# Patient Record
Sex: Male | Born: 1975 | Race: White | Hispanic: No | Marital: Single | Smoking: Former smoker
Health system: Southern US, Community
[De-identification: ages and names within clinical notes are randomized; demographics above are authoritative.]

## PROBLEM LIST (undated history)

## (undated) DIAGNOSIS — R112 Nausea with vomiting, unspecified: Secondary | ICD-10-CM

## (undated) DIAGNOSIS — E7800 Pure hypercholesterolemia, unspecified: Secondary | ICD-10-CM

## (undated) DIAGNOSIS — Z9889 Other specified postprocedural states: Secondary | ICD-10-CM

## (undated) DIAGNOSIS — T8859XA Other complications of anesthesia, initial encounter: Secondary | ICD-10-CM

## (undated) DIAGNOSIS — I10 Essential (primary) hypertension: Secondary | ICD-10-CM

## (undated) HISTORY — PX: BACK SURGERY: SHX140

---

## 2003-01-31 HISTORY — PX: CARPAL TUNNEL RELEASE: SHX101

## 2013-02-28 ENCOUNTER — Ambulatory Visit (INDEPENDENT_AMBULATORY_CARE_PROVIDER_SITE_OTHER): Payer: BC Managed Care – PPO | Admitting: Family Medicine

## 2013-02-28 ENCOUNTER — Encounter (INDEPENDENT_AMBULATORY_CARE_PROVIDER_SITE_OTHER): Payer: Self-pay

## 2013-02-28 VITALS — BP 134/69 | HR 82 | Temp 98.6°F | Ht 72.0 in | Wt 258.2 lb

## 2013-02-28 DIAGNOSIS — J069 Acute upper respiratory infection, unspecified: Secondary | ICD-10-CM

## 2013-02-28 MED ORDER — AZITHROMYCIN 250 MG PO TABS: ORAL_TABLET | ORAL | Status: DC

## 2013-02-28 NOTE — Progress Notes (Signed)
   Subjective:    Patient ID: Tanner Wilson, male    DOB: 11/06/1975, 38 y.o.   MRN: [id number]  HPI  This 38 y.o. male presents for evaluation of uri sx's for over a week.  He c/o feeling like he is in Tunnel and feels some discomfort in his bilateral ears.  Review of Systems C/o uri sx's   No chest pain, SOB, HA, dizziness, vision change, N/V, diarrhea, constipation, dysuria, urinary urgency or frequency, myalgias, arthralgias or rash.  Objective:   Physical Exam Vital signs noted  Well developed well nourished male.  HEENT - Head atraumatic Normocephalic                Eyes - PERRLA, Conjuctiva - clear Sclera- Clear EOMI                Ears - EAC's Wnl TM's Wnl Gross Hearing WNL                Nose - Nares decreased patentcy                Throat - oropharanx wnl Respiratory - Lungs CTA bilateral Cardiac - RRR S1 and S2 without murmur GI - Abdomen soft Nontender and bowel sounds active x 4        Assessment & Plan:  URI (upper respiratory infection) - Plan: azithromycin (ZITHROMAX) 250 MG tablet Push po fluids, rest, tylenol and motrin otc prn as directed for fever, arthralgias, and myalgias.  Follow up prn if sx's continue or persist.  Deatra CanterWilliam J Oxford FNP

## 2013-02-28 NOTE — Patient Instructions (Signed)

## 2013-04-04 ENCOUNTER — Encounter: Payer: Self-pay | Admitting: Nurse Practitioner

## 2013-04-04 ENCOUNTER — Ambulatory Visit (INDEPENDENT_AMBULATORY_CARE_PROVIDER_SITE_OTHER): Payer: BC Managed Care – PPO | Admitting: Nurse Practitioner

## 2013-04-04 VITALS — BP 138/82 | HR 78 | Temp 98.8°F | Ht 74.0 in | Wt 253.0 lb

## 2013-04-04 DIAGNOSIS — Z0000 Encounter for general adult medical examination without abnormal findings: Secondary | ICD-10-CM

## 2013-04-04 LAB — POCT CBC
GRANULOCYTE PERCENT: 57.8 % (ref 37–80)
HCT, POC: 47.5 % (ref 43.5–53.7)
Hemoglobin: 15.6 g/dL (ref 14.1–18.1)
Lymph, poc: 2.5 (ref 0.6–3.4)
MCH, POC: 28.9 pg (ref 27–31.2)
MCHC: 32.8 g/dL (ref 31.8–35.4)
MCV: 88.2 fL (ref 80–97)
MPV: 7.4 fL (ref 0–99.8)
PLATELET COUNT, POC: 203 10*3/uL (ref 142–424)
POC Granulocyte: 4 (ref 2–6.9)
POC LYMPH PERCENT: 36 %L (ref 10–50)
RBC: 5.4 M/uL (ref 4.69–6.13)
RDW, POC: 12.6 %
WBC: 7 10*3/uL (ref 4.6–10.2)

## 2013-04-04 NOTE — Patient Instructions (Signed)

## 2013-04-04 NOTE — Progress Notes (Signed)
   Subjective:    Patient ID: Tanner Wilson, male    DOB: 1975/08/24, 38 y.o.   MRN: [id number]  HPI Patient here today for CPE- he is doing well without complaints today.    Review of Systems  Constitutional: Negative.   HENT: Negative.   Eyes: Negative.   Respiratory: Negative.   Cardiovascular: Negative.   Gastrointestinal: Negative.   Endocrine: Negative.   Genitourinary: Negative.   Musculoskeletal: Negative.   Neurological: Negative.   Hematological: Negative.   Psychiatric/Behavioral: Negative.   All other systems reviewed and are negative.       Objective:   Physical Exam  Constitutional: He is oriented to person, place, and time. He appears well-developed and well-nourished.  HENT:  Head: Normocephalic.  Right Ear: External ear normal.  Left Ear: External ear normal.  Nose: Nose normal.  Mouth/Throat: Oropharynx is clear and moist.  Eyes: EOM are normal. Pupils are equal, round, and reactive to light.  Neck: Normal range of motion. Neck supple. No JVD present. No thyromegaly present.  Cardiovascular: Normal rate, regular rhythm, normal heart sounds and intact distal pulses.  Exam reveals no gallop and no friction rub.   No murmur heard. Pulmonary/Chest: Effort normal and breath sounds normal. No respiratory distress. He has no wheezes. He has no rales. He exhibits no tenderness.  Abdominal: Soft. Bowel sounds are normal. He exhibits no mass. There is no tenderness.  Genitourinary: Penis normal.  Musculoskeletal: Normal range of motion. He exhibits no edema.  Lymphadenopathy:    He has no cervical adenopathy.  Neurological: He is alert and oriented to person, place, and time. No cranial nerve deficit.  Skin: Skin is warm and dry.  Psychiatric: He has a normal mood and affect. His behavior is normal. Judgment and thought content normal.     BP 138/82  Pulse 78  Temp(Src) 98.8 F (37.1 C) (Oral)  Ht _0  (1.88 m)  Wt 253 lb (114.76 kg)  BMI 32.47  kg/m2       Assessment & Plan:   1. Annual physical exam    Orders Placed This Encounter  Procedures  . CMP14+EGFR  . NMR, lipoprofile  . PSA, total and free  . Thyroid Panel With TSH  . POCT CBC    Labs pending Health maintenance reviewed Diet and exercise encouraged Continue all meds Follow up  In 1 year   [place], FNP

## 2013-04-06 LAB — THYROID PANEL WITH TSH
Free Thyroxine Index: 2 (ref 1.2–4.9)
T3 UPTAKE RATIO: 32 % (ref 24–39)
T4 TOTAL: 6.3 ug/dL (ref 4.5–12.0)
TSH: 0.199 u[IU]/mL — AB (ref 0.450–4.500)

## 2013-04-06 LAB — NMR, LIPOPROFILE
Cholesterol: 187 mg/dL (ref <200)
HDL CHOLESTEROL BY NMR: 46 mg/dL (ref >=40)
HDL Particle Number: 29.6 umol/L — ABNORMAL LOW (ref >=30.5)
LDL Particle Number: 1599 nmol/L — ABNORMAL HIGH (ref <1000)
LDL Size: 21.3 nm (ref >20.5)
LDLC SERPL CALC-MCNC: 120 mg/dL — AB (ref <100)
LP-IR Score: 60 — ABNORMAL HIGH (ref <=45)
Small LDL Particle Number: 441 nmol/L (ref <=527)
TRIGLYCERIDES BY NMR: 103 mg/dL (ref <150)

## 2013-04-06 LAB — CMP14+EGFR
ALBUMIN: 4.8 g/dL (ref 3.5–5.5)
ALK PHOS: 85 IU/L (ref 39–117)
ALT: 27 IU/L (ref 0–44)
AST: 23 IU/L (ref 0–40)
Albumin/Globulin Ratio: 1.6 (ref 1.1–2.5)
BILIRUBIN TOTAL: 0.8 mg/dL (ref 0.0–1.2)
BUN / CREAT RATIO: 10 (ref 8–19)
BUN: 10 mg/dL (ref 6–20)
CHLORIDE: 100 mmol/L (ref 97–108)
CO2: 22 mmol/L (ref 18–29)
Calcium: 9.2 mg/dL (ref 8.7–10.2)
Creatinine, Ser: 1.03 mg/dL (ref 0.76–1.27)
GFR calc Af Amer: 108 mL/min/{1.73_m2} (ref >59)
GFR, EST NON AFRICAN AMERICAN: 94 mL/min/{1.73_m2} (ref >59)
Globulin, Total: 3 g/dL (ref 1.5–4.5)
Glucose: 94 mg/dL (ref 65–99)
POTASSIUM: 4.1 mmol/L (ref 3.5–5.2)
Sodium: 141 mmol/L (ref 134–144)
Total Protein: 7.8 g/dL (ref 6.0–8.5)

## 2013-04-06 LAB — PSA, TOTAL AND FREE
PSA FREE PCT: 42.5 %
PSA FREE: 0.17 ng/mL
PSA: 0.4 ng/mL (ref 0.0–4.0)

## 2013-04-08 ENCOUNTER — Telehealth: Payer: Self-pay | Admitting: *Deleted

## 2013-04-08 NOTE — Telephone Encounter (Signed)
Message copied by Almeta MonasSTONE, JANIE M on Tue Apr 08, 2013 11:22 AM ------      Message from: Bennie PieriniMARTIN, MARY-MARGARET      Created: Mon Apr 07, 2013  1:59 PM       Cbc normal      Kidney and liver function stable      LDL particle # and LDL are elevated- Low CVA risk currently- low fat diet and exercise and recheck in 3 months      PSA normal      TSH is low which means Hyperhyroidism- but rest of thyroid panel is normal- repeat labs iin 3 months ------

## 2013-04-08 NOTE — Telephone Encounter (Signed)
Patient aware.

## 2013-04-08 NOTE — Telephone Encounter (Signed)
Please call for results

## 2013-04-24 ENCOUNTER — Encounter: Payer: Self-pay | Admitting: *Deleted

## 2014-05-12 ENCOUNTER — Ambulatory Visit (INDEPENDENT_AMBULATORY_CARE_PROVIDER_SITE_OTHER): Payer: BC Managed Care – PPO | Admitting: Nurse Practitioner

## 2014-05-12 ENCOUNTER — Encounter: Payer: Self-pay | Admitting: Nurse Practitioner

## 2014-05-12 VITALS — BP 149/97 | HR 95 | Temp 98.1°F | Ht 74.0 in | Wt 269.0 lb

## 2014-05-12 DIAGNOSIS — T148 Other injury of unspecified body region: Secondary | ICD-10-CM | POA: Diagnosis not present

## 2014-05-12 DIAGNOSIS — W57XXXA Bitten or stung by nonvenomous insect and other nonvenomous arthropods, initial encounter: Secondary | ICD-10-CM | POA: Diagnosis not present

## 2014-05-12 MED ORDER — EPINEPHRINE 0.3 MG/0.3ML IJ SOAJ: 0.3000 mg | Freq: Once | INTRAMUSCULAR | Status: DC

## 2014-05-12 NOTE — Progress Notes (Signed)
   Subjective:    Patient ID: Tanner Wilson, male    DOB: 11/28/1975, 39 y.o.   MRN: [id number]  HPI The patient states he had a tick bite 3 years ago, and 2 months later had an episode of rashes appearing around his joint, as well as N/V/D which lasted 2 days after eating a steak. He cut out red meat, and was gradually increasing red meat intake which he was tolerating well. He states on 3 days ago he ate a steak and experienced N/V/D similar to the episode he had previously without the rash, with sudden onset of weakness. These symptoms ended yesterday, states he feels fine today. He states he would like to be screened for alpha-gal's allergy.    Review of Systems  Constitutional: Negative for fever and diaphoresis.  HENT: Negative for congestion, postnasal drip and sinus pressure.   Respiratory: Negative for cough, chest tightness and shortness of breath.   Cardiovascular: Negative for chest pain and palpitations.  Musculoskeletal: Negative for joint swelling.  Skin: Negative for rash.  Neurological: Negative for syncope, facial asymmetry and light-headedness.  All other systems reviewed and are negative.      Objective:   Physical Exam  Constitutional: He is oriented to person, place, and time. He appears well-developed and well-nourished. No distress.  HENT:  Mouth/Throat: Oropharynx is clear and moist.  Cardiovascular: Normal rate, regular rhythm and normal heart sounds.   Pulmonary/Chest: Effort normal and breath sounds normal.  Neurological: He is alert and oriented to person, place, and time.  Skin: Skin is warm and dry. He is not diaphoretic.  Psychiatric: He has a normal mood and affect. His behavior is normal. Judgment and thought content normal.   BP 149/97 mmHg  Pulse 95  Temp(Src) 98.1 F (36.7 C) (Oral)  Ht 6\' 2"  (1.88 m)  Wt 269 lb (122.018 kg)  BMI 34.52 kg/m2        Assessment & Plan:   1. Tick bite    Orders Placed This Encounter  Procedures  .  Rocky mtn spotted fvr abs pnl(IgG+IgM)  . Lyme Ab/Western Blot Reflex  . Alpha-Gal Panel   Meds ordered this encounter  Medications  . EPINEPHrine 0.3 mg/0.3 mL IJ SOAJ injection    Sig: Inject 0.3 mLs (0.3 mg total) into the muscle once.    Dispense:  2 Device    Refill:  1    Order Specific Question:  Supervising Provider    Answer:  Ernestina PennaMOORE, DONALD W [[id number]]   Avoid red meat and pork until test are back RTO prn  Mary-Margaret Daphine DeutscherMartin, FNP

## 2014-05-12 NOTE — Patient Instructions (Signed)
Epinephrine injection (Auto-injector) °What is this medicine? °EPINEPHRINE (ep i NEF rin) is used for the emergency treatment of severe allergic reactions. You should keep this medicine with you at all times. °This medicine may be used for other purposes; ask your health care provider or pharmacist if you have questions. °COMMON BRAND NAME(S): Adrenaclick, Auvi-Q, EpiPen, Twinject °What should I tell my health care provider before I take this medicine? °They need to know if you have any of the following conditions: °-diabetes °-heart disease °-high blood pressure °-lung or breathing disease, like asthma °-Parkinson's disease °-thyroid disease °-an unusual or allergic reaction to epinephrine, sulfites, other medicines, foods, dyes, or preservatives °-pregnant or trying to get pregnant °-breast-feeding °How should I use this medicine? °This medicine is for injection into the outer thigh. Your doctor or health care professional will instruct you on the proper use of the device during an emergency. Read all directions carefully and make sure you understand them. Do not use more often than directed. °Talk to your pediatrician regarding the use of this medicine in children. Special care may be needed. This drug is commonly used in children. A special device is available for use in children. °Overdosage: If you think you have taken too much of this medicine contact a poison control center or emergency room at once. °NOTE: This medicine is only for you. Do not share this medicine with others. °What if I miss a dose? °This does not apply. You should only use this medicine for an allergic reaction. °What may interact with this medicine? °This medicine is only used during an emergency. Significant drug interactions are not likely during emergency use. °This list may not describe all possible interactions. Give your health care provider a list of all the medicines, herbs, non-prescription drugs, or dietary supplements you use.  Also tell them if you smoke, drink alcohol, or use illegal drugs. Some items may interact with your medicine. °What should I watch for while using this medicine? °Keep this medicine ready for use in the case of a severe allergic reaction. Make sure that you have the phone number of your doctor or health care professional and local hospital ready. Remember to check the expiration date of your medicine regularly. You may need to have additional units of this medicine with you at work, school, or other places. Talk to your doctor or health care professional about your need for extra units. Some emergencies may require an additional dose. Check with your doctor or a health care professional before using an extra dose. °After use, go to the nearest hospital or call 911. Avoid physical activity. Make sure the treating health care professional knows you have received an injection of this medicine. You will receive additional instructions on what to do during and after use of this medicine before a medical emergency occurs. °What side effects may I notice from receiving this medicine? °Side effects that you should report to your doctor or health care professional as soon as possible: °-allergic reactions like skin rash, itching or hives, swelling of the face, lips, or tongue °-breathing problems °-chest pain °-flushing °-irregular or pounding heartbeat °-numbness in fingers or toes °-vomiting °Side effects that usually do not require medical attention (report to your doctor or health care professional if they continue or are bothersome): °-anxiety or nervousness °-dizzy, drowsy °-dry mouth °-headache °-increased sweating °-nausea °-tired, weak °This list may not describe all possible side effects. Call your doctor for medical advice about side effects. You may report side   effects to FDA at 1-800-FDA-1088. °Where should I keep my medicine? °Keep out of the reach of children. °Store at room temperature between 15 and 30  degrees C (59 and 86 degrees F). Protect from light and heat. The solution should be clear in color. If the solution is discolored or contains particles it must be replaced. Throw away any unused medicine after the expiration date. Ask your doctor or pharmacist about proper disposal of the injector if it is expired or has been used. Always replace your auto-injector before it expires. °NOTE: This sheet is a summary. It may not cover all possible information. If you have questions about this medicine, talk to your doctor, pharmacist, or health care provider. °© 2015, Elsevier/[employer]. (2012-05-27 14:59:01) ° °

## 2014-05-16 LAB — ROCKY MTN SPOTTED FVR ABS PNL(IGG+IGM)
RMSF IGG: POSITIVE — AB
RMSF IgM: 0.21 index (ref 0.00–0.89)

## 2014-05-16 LAB — ALPHA-GAL PANEL
ALPHA GAL IGE: 1.9 kU/L — AB (ref <0.35)
BEEF CLASS INTERPRETATION: 2
Beef (Bos spp) IgE: 1.15 kU/L — ABNORMAL HIGH (ref <0.35)
Class Interpretation: 2
Lamb/Mutton (Ovis spp) IgE: 0.34 kU/L (ref <0.35)
PORK (SUS SPP) IGE: 0.75 kU/L — AB (ref <0.35)

## 2014-05-16 LAB — RMSF, IGG, IFA: RMSF, IGG, IFA: 1:64 {titer} — ABNORMAL HIGH

## 2014-05-16 LAB — LYME AB/WESTERN BLOT REFLEX
LYME DISEASE AB, QUANT, IGM: <0.8 index (ref 0.00–0.79)
Lyme IgG/IgM Ab: <0.91 {ISR} (ref 0.00–0.90)

## 2014-05-18 ENCOUNTER — Other Ambulatory Visit: Payer: Self-pay | Admitting: Nurse Practitioner

## 2014-05-18 MED ORDER — DOXYCYCLINE HYCLATE 100 MG PO TABS: 100.0000 mg | ORAL_TABLET | Freq: Two times a day (BID) | ORAL | Status: DC

## 2014-05-18 NOTE — Progress Notes (Signed)
PATIENT AWARE OF LABS AND RX

## 2015-07-16 ENCOUNTER — Encounter: Payer: Self-pay | Admitting: Family

## 2015-07-16 ENCOUNTER — Ambulatory Visit (INDEPENDENT_AMBULATORY_CARE_PROVIDER_SITE_OTHER): Payer: BC Managed Care – PPO | Admitting: Family

## 2015-07-16 VITALS — BP 139/91 | HR 72 | Temp 97.8°F | Ht 74.0 in | Wt 285.4 lb

## 2015-07-16 DIAGNOSIS — Z8249 Family history of ischemic heart disease and other diseases of the circulatory system: Secondary | ICD-10-CM

## 2015-07-16 DIAGNOSIS — R0681 Apnea, not elsewhere classified: Secondary | ICD-10-CM | POA: Diagnosis not present

## 2015-07-16 DIAGNOSIS — R0683 Snoring: Secondary | ICD-10-CM | POA: Diagnosis not present

## 2015-07-16 DIAGNOSIS — Z91018 Allergy to other foods: Secondary | ICD-10-CM | POA: Insufficient documentation

## 2015-07-16 DIAGNOSIS — I10 Essential (primary) hypertension: Secondary | ICD-10-CM | POA: Diagnosis not present

## 2015-07-16 MED ORDER — HYDROCHLOROTHIAZIDE 25 MG PO TABS: 25.0000 mg | ORAL_TABLET | Freq: Every day | ORAL | Status: DC

## 2015-07-16 NOTE — Patient Instructions (Signed)
Sleep Apnea  Sleep apnea is a sleep disorder characterized by abnormal pauses in breathing while you sleep. When your breathing pauses, the level of oxygen in your blood decreases. This causes you to move out of deep sleep and into light sleep. As a result, your quality of sleep is poor, and the system that carries your blood throughout your body (cardiovascular system) experiences stress. If sleep apnea remains untreated, the following conditions can develop:  High blood pressure (hypertension).  Coronary artery disease.  Inability to achieve or maintain an erection (impotence).  Impairment of your thought process (cognitive dysfunction). There are three types of sleep apnea: 1. Obstructive sleep apnea--Pauses in breathing during sleep because of a blocked airway. 2. Central sleep apnea--Pauses in breathing during sleep because the area of the brain that controls your breathing does not send the correct signals to the muscles that control breathing. 3. Mixed sleep apnea--A combination of both obstructive and central sleep apnea. RISK FACTORS The following risk factors can increase your risk of developing sleep apnea:  Being overweight.  Smoking.  Having narrow passages in your nose and throat.  Being of older age.  Being male.  Alcohol use.  Sedative and tranquilizer use.  Ethnicity. Among individuals younger than 35 years, African Americans are at increased risk of sleep apnea. SYMPTOMS   Difficulty staying asleep.  Daytime sleepiness and fatigue.  Loss of energy.  Irritability.  Loud, heavy snoring.  Morning headaches.  Trouble concentrating.  Forgetfulness.  Decreased interest in sex.  Unexplained sleepiness. DIAGNOSIS  In order to diagnose sleep apnea, your caregiver will perform a physical examination. A sleep study done in the comfort of your own home may be appropriate if you are otherwise healthy. Your caregiver may also recommend that you spend the  night in a sleep lab. In the sleep lab, several monitors record information about your heart, lungs, and brain while you sleep. Your leg and arm movements and blood oxygen level are also recorded. TREATMENT The following actions may help to resolve mild sleep apnea:  Sleeping on your side.   Using a decongestant if you have nasal congestion.   Avoiding the use of depressants, including alcohol, sedatives, and narcotics.   Losing weight and modifying your diet if you are overweight. There also are devices and treatments to help open your airway:  Oral appliances. These are custom-made mouthpieces that shift your lower jaw forward and slightly open your bite. This opens your airway.  Devices that create positive airway pressure. This positive pressure "splints" your airway open to help you breathe better during sleep. The following devices create positive airway pressure:  Continuous positive airway pressure (CPAP) device. The CPAP device creates a continuous level of air pressure with an air pump. The air is delivered to your airway through a mask while you sleep. This continuous pressure keeps your airway open.  Nasal expiratory positive airway pressure (EPAP) device. The EPAP device creates positive air pressure as you exhale. The device consists of single-use valves, which are inserted into each nostril and held in place by adhesive. The valves create very little resistance when you inhale but create much more resistance when you exhale. That increased resistance creates the positive airway pressure. This positive pressure while you exhale keeps your airway open, making it easier to breath when you inhale again.  Bilevel positive airway pressure (BPAP) device. The BPAP device is used mainly in patients with central sleep apnea. This device is similar to the CPAP device because   it also uses an air pump to deliver continuous air pressure through a mask. However, with the BPAP machine, the  pressure is set at two different levels. The pressure when you exhale is lower than the pressure when you inhale.  Surgery. Typically, surgery is only done if you cannot comply with less invasive treatments or if the less invasive treatments do not improve your condition. Surgery involves removing excess tissue in your airway to create a wider passage way.   This information is not intended to replace advice given to you by your health care provider. Make sure you discuss any questions you have with your health care provider.   Document Released: 01/06/2002 Document Revised: 02/06/2014 Document Reviewed: 05/25/2011 Elsevier Interactive Patient Education 2016 [employer].   

## 2015-07-16 NOTE — Progress Notes (Signed)
   Subjective:    Patient ID: Tanner Wilson, male    DOB: [birth year], 40 y.o.   MRN: [id number]  PT presents to the office today requesting lab work to check his alpha gal/meat allergy. PT states he has had this going on for a few years, but was just tested last year. Pt denies any recent tick bites. Pt denies any headache, palpitations, SOB, or edema at this time. Wife states patient is snoring and gasps at night.  Hypertension This is a recurrent problem. The current episode started more than 1 year ago. The problem has been waxing and waning since onset. The problem is uncontrolled. Associated symptoms include headaches and malaise/fatigue. Pertinent negatives include no anxiety, palpitations or peripheral edema. Risk factors for coronary artery disease include family history, obesity and sedentary lifestyle. Past treatments include nothing. The current treatment provides no improvement. There is no history of kidney disease, CAD/MI, CVA or heart failure.       Review of Systems  Constitutional: Positive for malaise/fatigue.  Respiratory: Negative.   Cardiovascular: Negative.  Negative for palpitations.  Gastrointestinal: Negative.   Endocrine: Negative.   Genitourinary: Negative.   Musculoskeletal: Negative.   Neurological: Positive for headaches.  Hematological: Negative.   Psychiatric/Behavioral: Negative.   All other systems reviewed and are negative.      Objective:   Physical Exam  Constitutional: He is oriented to person, place, and time. He appears well-developed and well-nourished. No distress.  HENT:  Head: Normocephalic.  Eyes: Pupils are equal, round, and reactive to light. Right eye exhibits no discharge. Left eye exhibits no discharge.  Neck: Normal range of motion. Neck supple. No thyromegaly present.  Cardiovascular: Normal rate, regular rhythm, normal heart sounds and intact distal pulses.   No murmur heard. Pulmonary/Chest: Effort normal and breath sounds  normal. No respiratory distress. He has no wheezes.  Abdominal: Soft. Bowel sounds are normal. He exhibits no distension. There is no tenderness.  Musculoskeletal: Normal range of motion. He exhibits no edema or tenderness.  Neurological: He is alert and oriented to person, place, and time. He has normal reflexes. No cranial nerve deficit.  Skin: Skin is warm and dry. No rash noted. No erythema.  Psychiatric: He has a normal mood and affect. His behavior is normal. Judgment and thought content normal.  Vitals reviewed.   BP 139/91 mmHg  Pulse 72  Temp(Src) 97.8 F (36.6 C) (Oral)  Ht '6\' 2"'$  (1.88 m)  Wt 285 lb 6.4 oz (129.457 kg)  BMI 36.63 kg/m2       Assessment & Plan:  1. Essential hypertension -Pt started on HCTZ 25 mg today - CMP14+EGFR - Lipid panel - hydrochlorothiazide (HYDRODIURIL) 25 MG tablet; Take 1 tablet (25 mg total) by mouth daily.  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 Snoring - CMP14+EGFR - Ambulatory referral to Sleep Studies  4. Apnea - CMP14+EGFR - Ambulatory referral to Sleep Studies  5. Family history of heart disease - CMP14+EGFR - Lipid panel   Continue all meds Labs pending Health Maintenance reviewed Diet and exercise encouraged RTO 2 week to recheck HTN  [name], FNP

## 2015-07-21 ENCOUNTER — Other Ambulatory Visit: Payer: Self-pay | Admitting: Family

## 2015-07-21 DIAGNOSIS — E785 Hyperlipidemia, unspecified: Secondary | ICD-10-CM | POA: Insufficient documentation

## 2015-07-21 LAB — LIPID PANEL
CHOL/HDL RATIO: 5.3 ratio — AB (ref 0.0–5.0)
CHOLESTEROL TOTAL: 205 mg/dL — AB (ref 100–199)
HDL: 39 mg/dL — AB (ref >39)
LDL CALC: 142 mg/dL — AB (ref 0–99)
Triglycerides: 122 mg/dL (ref 0–149)
VLDL Cholesterol Cal: 24 mg/dL (ref 5–40)

## 2015-07-21 LAB — CMP14+EGFR
ALK PHOS: 82 IU/L (ref 39–117)
ALT: 46 IU/L — AB (ref 0–44)
AST: 31 IU/L (ref 0–40)
Albumin/Globulin Ratio: 1.2 (ref 1.2–2.2)
Albumin: 4.2 g/dL (ref 3.5–5.5)
BUN/Creatinine Ratio: 11 (ref 9–20)
BUN: 10 mg/dL (ref 6–20)
Bilirubin Total: 0.7 mg/dL (ref 0.0–1.2)
CALCIUM: 9 mg/dL (ref 8.7–10.2)
CO2: 20 mmol/L (ref 18–29)
Chloride: 101 mmol/L (ref 96–106)
Creatinine, Ser: 0.91 mg/dL (ref 0.76–1.27)
GFR calc Af Amer: 124 mL/min/{1.73_m2} (ref >59)
GFR, EST NON AFRICAN AMERICAN: 107 mL/min/{1.73_m2} (ref >59)
GLUCOSE: 107 mg/dL — AB (ref 65–99)
Globulin, Total: 3.4 g/dL (ref 1.5–4.5)
Potassium: 4.5 mmol/L (ref 3.5–5.2)
Sodium: 139 mmol/L (ref 134–144)
Total Protein: 7.6 g/dL (ref 6.0–8.5)

## 2015-07-21 LAB — ROCKY MTN SPOTTED FVR ABS PNL(IGG+IGM)
RMSF IGG: POSITIVE — AB
RMSF IgM: 0.29 index (ref 0.00–0.89)

## 2015-07-21 LAB — ALPHA-GAL PANEL
Alpha Gal IgE*: 1.62 kU/L — ABNORMAL HIGH (ref <0.35)
Beef (Bos spp) IgE: 0.97 kU/L — ABNORMAL HIGH (ref <0.35)
Class Interpretation: 1
Class Interpretation: 2
Lamb/Mutton (Ovis spp) IgE: 0.2 kU/L (ref <0.35)
Pork (Sus spp) IgE: 0.57 kU/L — ABNORMAL HIGH (ref <0.35)

## 2015-07-21 LAB — RMSF, IGG, IFA: RMSF, IGG, IFA: 1:64 {titer} — ABNORMAL HIGH

## 2015-07-21 MED ORDER — ATORVASTATIN CALCIUM 20 MG PO TABS: 20.0000 mg | ORAL_TABLET | Freq: Every day | ORAL | Status: DC

## 2015-08-05 ENCOUNTER — Ambulatory Visit: Payer: BC Managed Care – PPO | Admitting: Family

## 2015-08-06 ENCOUNTER — Encounter: Payer: Self-pay | Admitting: Nurse Practitioner

## 2015-08-13 ENCOUNTER — Encounter: Payer: Self-pay | Admitting: Family

## 2015-08-13 ENCOUNTER — Ambulatory Visit (INDEPENDENT_AMBULATORY_CARE_PROVIDER_SITE_OTHER): Payer: BC Managed Care – PPO | Admitting: Family

## 2015-08-13 VITALS — BP 140/96 | HR 95 | Temp 97.8°F | Ht 74.0 in | Wt 285.0 lb

## 2015-08-13 DIAGNOSIS — E785 Hyperlipidemia, unspecified: Secondary | ICD-10-CM | POA: Diagnosis not present

## 2015-08-13 DIAGNOSIS — E8881 Metabolic syndrome: Secondary | ICD-10-CM

## 2015-08-13 DIAGNOSIS — W57XXXA Bitten or stung by nonvenomous insect and other nonvenomous arthropods, initial encounter: Secondary | ICD-10-CM

## 2015-08-13 DIAGNOSIS — I10 Essential (primary) hypertension: Secondary | ICD-10-CM | POA: Diagnosis not present

## 2015-08-13 DIAGNOSIS — Z91018 Allergy to other foods: Secondary | ICD-10-CM

## 2015-08-13 MED ORDER — LISINOPRIL-HYDROCHLOROTHIAZIDE 20-12.5 MG PO TABS: 1.0000 | ORAL_TABLET | Freq: Every day | ORAL | Status: AC

## 2015-08-13 MED ORDER — EPINEPHRINE 0.3 MG/0.3ML IJ SOAJ: 0.3000 mg | Freq: Once | INTRAMUSCULAR | Status: DC

## 2015-08-13 MED ORDER — ATORVASTATIN CALCIUM 20 MG PO TABS: 20.0000 mg | ORAL_TABLET | Freq: Every day | ORAL | Status: DC

## 2015-08-13 NOTE — Patient Instructions (Signed)
Hypertension Hypertension, commonly called high blood pressure, is when the force of blood pumping through your arteries is too strong. Your arteries are the blood vessels that carry blood from your heart throughout your body. A blood pressure reading consists of a higher number over a lower number, such as 110/72. The higher number (systolic) is the pressure inside your arteries when your heart pumps. The lower number (diastolic) is the pressure inside your arteries when your heart relaxes. Ideally you want your blood pressure below 120/80. Hypertension forces your heart to work harder to pump blood. Your arteries may become narrow or stiff. Having untreated or uncontrolled hypertension can cause heart attack, stroke, kidney disease, and other problems. RISK FACTORS Some risk factors for high blood pressure are controllable. Others are not.  Risk factors you cannot control include:   Race. You may be at higher risk if you are African American.  Age. Risk increases with age.  Gender. Men are at higher risk than women before age 45 years. After age 65, women are at higher risk than men. Risk factors you can control include:  Not getting enough exercise or physical activity.  Being overweight.  Getting too much fat, sugar, calories, or salt in your diet.  Drinking too much alcohol. SIGNS AND SYMPTOMS Hypertension does not usually cause signs or symptoms. Extremely high blood pressure (hypertensive crisis) may cause headache, anxiety, shortness of breath, and nosebleed. DIAGNOSIS To check if you have hypertension, your health care provider will measure your blood pressure while you are seated, with your arm held at the level of your heart. It should be measured at least twice using the same arm. Certain conditions can cause a difference in blood pressure between your right and left arms. A blood pressure reading that is higher than normal on one occasion does not mean that you need treatment. If  it is not clear whether you have high blood pressure, you may be asked to return on a different day to have your blood pressure checked again. Or, you may be asked to monitor your blood pressure at home for 1 or more weeks. TREATMENT Treating high blood pressure includes making lifestyle changes and possibly taking medicine. Living a healthy lifestyle can help lower high blood pressure. You may need to change some of your habits. Lifestyle changes may include:  Following the DASH diet. This diet is high in fruits, vegetables, and whole grains. It is low in salt, red meat, and added sugars.  Keep your sodium intake below 2,300 mg per day.  Getting at least 30-45 minutes of aerobic exercise at least 4 times per week.  Losing weight if necessary.  Not smoking.  Limiting alcoholic beverages.  Learning ways to reduce stress. Your health care provider may prescribe medicine if lifestyle changes are not enough to get your blood pressure under control, and if one of the following is true:  You are 18-59 years of age and your systolic blood pressure is above 140.  You are 60 years of age or older, and your systolic blood pressure is above 150.  Your diastolic blood pressure is above 90.  You have diabetes, and your systolic blood pressure is over 140 or your diastolic blood pressure is over 90.  You have kidney disease and your blood pressure is above 140/90.  You have heart disease and your blood pressure is above 140/90. Your personal target blood pressure may vary depending on your medical conditions, your age, and other factors. HOME CARE INSTRUCTIONS    Have your blood pressure rechecked as directed by your health care provider.   Take medicines only as directed by your health care provider. Follow the directions carefully. Blood pressure medicines must be taken as prescribed. The medicine does not work as well when you skip doses. Skipping doses also puts you at risk for  problems.  Do not smoke.   Monitor your blood pressure at home as directed by your health care provider. SEEK MEDICAL CARE IF:   You think you are having a reaction to medicines taken.  You have recurrent headaches or feel dizzy.  You have swelling in your ankles.  You have trouble with your vision. SEEK IMMEDIATE MEDICAL CARE IF:  You develop a severe headache or confusion.  You have unusual weakness, numbness, or feel faint.  You have severe chest or abdominal pain.  You vomit repeatedly.  You have trouble breathing. MAKE SURE YOU:   Understand these instructions.  Will watch your condition.  Will get help right away if you are not doing well or get worse.   This information is not intended to replace advice given to you by your health care provider. Make sure you discuss any questions you have with your health care provider.   Document Released: 01/16/2005 Document Revised: 06/02/2014 Document Reviewed: 11/08/2012 Elsevier Interactive Patient Education 2016 Elsevier Inc[employer] DASH Eating Plan DASH stands for "Dietary Approaches to Stop Hypertension." The DASH eating plan is a healthy eating plan that has been shown to reduce high blood pressure (hypertension). Additional health benefits may include reducing the risk of type 2 diabetes mellitus, heart disease, and stroke. The DASH eating plan may also help with weight loss. WHAT DO I NEED TO KNOW ABOUT THE DASH EATING PLAN? For the DASH eating plan, you will follow these general guidelines:  Choose foods with a percent daily value for sodium of less than 5% (as listed on the food label).  Use salt-free seasonings or herbs instead of table salt or sea salt.  Check with your health care provider or pharmacist before using salt substitutes.  Eat lower-sodium products, often labeled as "lower sodium" or "no salt added."  Eat fresh foods.  Eat more vegetables, fruits, and low-fat dairy products.  Choose whole grains.  Look for the word "whole" as the first word in the ingredient list.  Choose fish and skinless chicken or turkey more often than red meat. Limit fish, poultry, and meat to 6 oz (170 g) each day.  Limit sweets, desserts, sugars, and sugary drinks.  Choose heart-healthy fats.  Limit cheese to 1 oz (28 g) per day.  Eat more home-cooked food and less restaurant, buffet, and fast food.  Limit fried foods.  Cook foods using methods other than frying.  Limit canned vegetables. If you do use them, rinse them well to decrease the sodium.  When eating at a restaurant, ask that your food be prepared with less salt, or no salt if possible. WHAT FOODS CAN I EAT? Seek help from a dietitian for individual calorie needs. Grains Whole grain or whole wheat bread. Brown rice. Whole grain or whole wheat pasta. Quinoa, bulgur, and whole grain cereals. Low-sodium cereals. Corn or whole wheat flour tortillas. Whole grain cornbread. Whole grain crackers. Low-sodium crackers. Vegetables Fresh or frozen vegetables (raw, steamed, roasted, or grilled). Low-sodium or reduced-sodium tomato and vegetable juices. Low-sodium or reduced-sodium tomato sauce and paste. Low-sodium or reduced-sodium canned vegetables.  Fruits All fresh, canned (in natural juice), or frozen fruits. Meat and Other   Protein Products Ground beef (85% or leaner), grass-fed beef, or beef trimmed of fat. Skinless chicken or turkey. Ground chicken or turkey. Pork trimmed of fat. All fish and seafood. Eggs. Dried beans, peas, or lentils. Unsalted nuts and seeds. Unsalted canned beans. Dairy Low-fat dairy products, such as skim or 1% milk, 2% or reduced-fat cheeses, low-fat ricotta or cottage cheese, or plain low-fat yogurt. Low-sodium or reduced-sodium cheeses. Fats and Oils Tub margarines without trans fats. Light or reduced-fat mayonnaise and salad dressings (reduced sodium). Avocado. Safflower, olive, or canola oils. Natural peanut or almond  butter. Other Unsalted popcorn and pretzels. The items listed above may not be a complete list of recommended foods or beverages. Contact your dietitian for more options. WHAT FOODS ARE NOT RECOMMENDED? Grains White bread. White pasta. White rice. Refined cornbread. Bagels and croissants. Crackers that contain trans fat. Vegetables Creamed or fried vegetables. Vegetables in a cheese sauce. Regular canned vegetables. Regular canned tomato sauce and paste. Regular tomato and vegetable juices. Fruits Dried fruits. Canned fruit in light or heavy syrup. Fruit juice. Meat and Other Protein Products Fatty cuts of meat. Ribs, chicken wings, bacon, sausage, bologna, salami, chitterlings, fatback, hot dogs, bratwurst, and packaged luncheon meats. Salted nuts and seeds. Canned beans with salt. Dairy Whole or 2% milk, cream, half-and-half, and cream cheese. Whole-fat or sweetened yogurt. Full-fat cheeses or blue cheese. Nondairy creamers and whipped toppings. Processed cheese, cheese spreads, or cheese curds. Condiments Onion and garlic salt, seasoned salt, table salt, and sea salt. Canned and packaged gravies. Worcestershire sauce. Tartar sauce. Barbecue sauce. Teriyaki sauce. Soy sauce, including reduced sodium. Steak sauce. Fish sauce. Oyster sauce. Cocktail sauce. Horseradish. Ketchup and mustard. Meat flavorings and tenderizers. Bouillon cubes. Hot sauce. Tabasco sauce. Marinades. Taco seasonings. Relishes. Fats and Oils Butter, stick margarine, lard, shortening, ghee, and bacon fat. Coconut, palm kernel, or palm oils. Regular salad dressings. Other Pickles and olives. Salted popcorn and pretzels. The items listed above may not be a complete list of foods and beverages to avoid. Contact your dietitian for more information. WHERE CAN I FIND MORE INFORMATION? National Heart, Lung, and Blood Institute: www.nhlbi.nih.gov/health/health-topics/topics/dash/   This information is not intended to replace  advice given to you by your health care provider. Make sure you discuss any questions you have with your health care provider.   Document Released: 01/05/2011 Document Revised: 02/06/2014 Document Reviewed: 11/20/2012 Elsevier Interactive Patient Education 2016 Elsevier Inc.  

## 2015-08-13 NOTE — Progress Notes (Signed)
   Subjective:    Patient ID: Tanner Wilson, male    DOB: 03-07-1975, 40 y.o.   MRN: [id number]  PT presents to the office to recheck HTN. PT's BP is not at goal. 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  Hypertension This is a chronic problem. The current episode started more than 1 year ago. The problem has been waxing and waning since onset. The problem is uncontrolled. Associated symptoms include malaise/fatigue. Pertinent negatives include no anxiety, headaches, palpitations or peripheral edema. Risk factors for coronary artery disease include family history, obesity and sedentary lifestyle. Past treatments include diuretics. The current treatment provides no improvement. There is no history of kidney disease, CAD/MI, CVA or heart failure.  Hyperlipidemia This is a chronic problem. The current episode started more than 1 year ago. The problem is uncontrolled. Recent lipid tests were reviewed and are high. Exacerbating diseases include obesity. Current antihyperlipidemic treatment includes diet change. The current treatment provides no improvement of lipids. Risk factors for coronary artery disease include dyslipidemia, hypertension, male sex, obesity and a sedentary lifestyle.  Metabolic Syndrome  Pt states he has a strenuous job and gets plenty of exercise. PT on low fat diet   Review of Systems  Constitutional: Positive for malaise/fatigue.  Cardiovascular: Negative for palpitations.  Neurological: Negative for headaches.  All other systems reviewed and are negative.      Objective:   Physical Exam  Constitutional: He is oriented to person, place, and time. He appears well-developed and well-nourished. No distress.  HENT:  Head: Normocephalic.  Eyes: Pupils are equal, round, and reactive to light. Right eye exhibits no discharge. Left eye exhibits no discharge.  Neck: Normal range of motion. Neck supple. No  thyromegaly present.  Cardiovascular: Normal rate, regular rhythm, normal heart sounds and intact distal pulses.   No murmur heard. Pulmonary/Chest: Effort normal and breath sounds normal. No respiratory distress. He has no wheezes.  Abdominal: Soft. Bowel sounds are normal. He exhibits no distension. There is no tenderness.  Musculoskeletal: Normal range of motion. He exhibits no edema or tenderness.  Neurological: He is alert and oriented to person, place, and time. He has normal reflexes. No cranial nerve deficit.  Skin: Skin is warm and dry. No rash noted. No erythema.  Psychiatric: He has a normal mood and affect. His behavior is normal. Judgment and thought content normal.  Vitals reviewed.     BP 140/96 mmHg  Pulse 95  Temp(Src) 97.8 F (36.6 C) (Oral)  Ht '6\' 2"'$  (1.88 m)  Wt 285 lb (129.275 kg)  BMI 36.58 kg/m2     Assessment & Plan:  1. Hyperlipidemia -PT told to start Lipitor  -Low fat diet - atorvastatin (LIPITOR) 20 MG tablet; Take 1 tablet (20 mg total) by mouth daily.  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 Essential hypertension -Pt started on Zestoretic 20-12.'5mg'$  and HCTZ '25mg'$  stopped today --Daily blood pressure log given with instructions on how to fill out and told to bring to next visit -Dash diet information given -Exercise encouraged - Stress Management  -Continue current meds -RTO in 2 weeks  - lisinopril-hydrochlorothiazide (ZESTORETIC) 20-12.5 MG tablet; Take 1 tablet by mouth daily.  Dispense: 90 tablet; Refill: 3 - BMP8+EGFR   Continue all meds Labs pending Health Maintenance reviewed Diet and exercise encouraged RTO 2 weeks to recheck HTN  [name], FNP

## 2015-08-14 LAB — BMP8+EGFR
BUN/Creatinine Ratio: 18 (ref 9–20)
BUN: 17 mg/dL (ref 6–20)
CALCIUM: 9.5 mg/dL (ref 8.7–10.2)
CHLORIDE: 97 mmol/L (ref 96–106)
CO2: 24 mmol/L (ref 18–29)
Creatinine, Ser: 0.92 mg/dL (ref 0.76–1.27)
GFR, EST AFRICAN AMERICAN: 122 mL/min/{1.73_m2} (ref >59)
GFR, EST NON AFRICAN AMERICAN: 106 mL/min/{1.73_m2} (ref >59)
Glucose: 155 mg/dL — ABNORMAL HIGH (ref 65–99)
Potassium: 4.8 mmol/L (ref 3.5–5.2)
Sodium: 139 mmol/L (ref 134–144)

## 2016-02-02 ENCOUNTER — Other Ambulatory Visit (HOSPITAL_BASED_OUTPATIENT_CLINIC_OR_DEPARTMENT_OTHER): Payer: Self-pay

## 2016-02-02 DIAGNOSIS — G4730 Sleep apnea, unspecified: Secondary | ICD-10-CM

## 2016-02-05 ENCOUNTER — Ambulatory Visit: Payer: BC Managed Care – PPO | Attending: Pulmonary Disease | Admitting: Neurology

## 2016-02-05 DIAGNOSIS — Z79899 Other long term (current) drug therapy: Secondary | ICD-10-CM | POA: Insufficient documentation

## 2016-02-05 DIAGNOSIS — G4733 Obstructive sleep apnea (adult) (pediatric): Secondary | ICD-10-CM | POA: Diagnosis not present

## 2016-02-05 DIAGNOSIS — G4730 Sleep apnea, unspecified: Secondary | ICD-10-CM | POA: Diagnosis present

## 2016-02-20 NOTE — Procedures (Signed)
  [place] A. Merlene Laughter, MD     [email or web address]             NOCTURNAL POLYSOMNOGRAPHY   LOCATION: [facility]   Patient Name: Tanner Wilson, Tanner Wilson Date: 02/05/2016 Gender: Male D.O.B: 21-Apr-1975 Age (years): 40 Referring Provider: Lennox Solders Height (inches): 10 Interpreting Physician: Phillips Odor MD, ABSM Weight (lbs): 275 RPSGT: Earney Hamburg BMI: 37 MRN: [id number] Neck Size: 17.00 CLINICAL INFORMATION Sleep Study Type: Split Night CPAP  Indication for sleep study: N/A  Epworth Sleepiness Score: 14  SLEEP STUDY TECHNIQUE As per the AASM Manual for the Scoring of Sleep and Associated Events v2.3 (April 2016) with a hypopnea requiring 4% desaturations.  The channels recorded and monitored were frontal, central and occipital EEG, electrooculogram (EOG), submentalis EMG (chin), nasal and oral airflow, thoracic and abdominal wall motion, anterior tibialis EMG, snore microphone, electrocardiogram, and pulse oximetry. Continuous positive airway pressure (CPAP) was initiated when the patient met split night criteria and was titrated according to treat sleep-disordered breathing.  MEDICATIONS Medications self-administered by patient taken the night of the study : N/A  Current Outpatient Prescriptions:  .  atorvastatin (LIPITOR) 20 MG tablet, Take 1 tablet (20 mg total) by mouth daily., Disp: 90 tablet, Rfl: 1 .  EPINEPHrine 0.3 mg/0.3 mL IJ SOAJ injection, Inject 0.3 mLs (0.3 mg total) into the muscle once., Disp: 2 Device, Rfl: 1 .  lisinopril-hydrochlorothiazide (ZESTORETIC) 20-12.5 MG tablet, Take 1 tablet by mouth daily., Disp: 90 tablet, Rfl: 3   RESPIRATORY PARAMETERS Diagnostic  Total AHI (/hr): 26.3 RDI (/hr): 32.0 OA Index (/hr): - CA Index (/hr): 0.5 REM AHI (/hr): 35.3 NREM AHI (/hr): 24.9 Supine AHI (/hr): 26.3 Non-supine AHI (/hr): N/A Min O2 Sat (%): 84.00 Mean O2 (%): 93.02 Time below 88% (min): 1.4   Titration  Optimal  Pressure (cm): 12 AHI at Optimal Pressure (/hr): 0.0 Min O2 at Optimal Pressure (%): 94.0 Supine % at Optimal (%): 100 Sleep % at Optimal (%): 100   SLEEP ARCHITECTURE The recording time for the entire night was 375.1 minutes.  During a baseline period of 173.5 minutes, the patient slept for 125.5 minutes in REM and nonREM, yielding a sleep efficiency of 72.3%. Sleep onset after lights out was 43.8 minutes with a REM latency of 91.0 minutes. The patient spent 3.19% of the night in stage N1 sleep, 83.27% in stage N2 sleep, 0.00% in stage N3 and 13.55% in REM.  During the titration period of 199.5 minutes, the patient slept for 150.8 minutes in REM and nonREM, yielding a sleep efficiency of 75.6%. Sleep onset after CPAP initiation was 43.7 minutes with a REM latency of 75.5 minutes. The patient spent 2.65% of the night in stage N1 sleep, 88.07% in stage N2 sleep, 0.00% in stage N3 and 9.28% in REM.  CARDIAC DATA The 2 lead EKG demonstrated sinus rhythm. The mean heart rate was 68.55 beats per minute. Other EKG findings include: None. LEG MOVEMENT DATA The total Periodic Limb Movements of Sleep (PLMS) were 1. The PLMS index was 0.22.  IMPRESSIONS - Moderate obstructive sleep apnea occurred during the diagnostic portion of the study(AHI = 26.3/hour). An optimal CPAP pressure was selected for this patient ( 12 cm of water).  Delano Metz, MD Diplomate, American Board of Sleep Medicine.

## 2016-05-12 ENCOUNTER — Ambulatory Visit (HOSPITAL_COMMUNITY)
Admission: RE | Admit: 2016-05-12 | Discharge: 2016-05-12 | Disposition: A | Discharge status: Home / Self Care | Payer: BC Managed Care – PPO | Source: Ambulatory Visit | Attending: Pulmonary Disease | Admitting: Pulmonary Disease

## 2016-05-12 ENCOUNTER — Other Ambulatory Visit (HOSPITAL_COMMUNITY): Payer: Self-pay | Admitting: Pulmonary Disease

## 2016-05-12 DIAGNOSIS — M545 Low back pain: Secondary | ICD-10-CM | POA: Insufficient documentation

## 2016-05-22 ENCOUNTER — Other Ambulatory Visit (HOSPITAL_COMMUNITY): Payer: Self-pay | Admitting: Pulmonary Disease

## 2016-05-22 DIAGNOSIS — M5432 Sciatica, left side: Secondary | ICD-10-CM

## 2016-05-24 ENCOUNTER — Other Ambulatory Visit (HOSPITAL_COMMUNITY): Payer: Self-pay | Admitting: Pulmonary Disease

## 2016-05-24 ENCOUNTER — Ambulatory Visit (HOSPITAL_COMMUNITY)
Admission: RE | Admit: 2016-05-24 | Discharge: 2016-05-24 | Disposition: A | Discharge status: Home / Self Care | Payer: BC Managed Care – PPO | Source: Ambulatory Visit | Attending: Pulmonary Disease | Admitting: Pulmonary Disease

## 2016-05-24 DIAGNOSIS — M5432 Sciatica, left side: Secondary | ICD-10-CM | POA: Insufficient documentation

## 2016-05-24 DIAGNOSIS — Z1389 Encounter for screening for other disorder: Secondary | ICD-10-CM | POA: Diagnosis present

## 2016-05-24 DIAGNOSIS — M48061 Spinal stenosis, lumbar region without neurogenic claudication: Secondary | ICD-10-CM | POA: Diagnosis not present

## 2016-05-24 DIAGNOSIS — M5126 Other intervertebral disc displacement, lumbar region: Secondary | ICD-10-CM | POA: Insufficient documentation

## 2016-05-24 DIAGNOSIS — M5135 Other intervertebral disc degeneration, thoracolumbar region: Secondary | ICD-10-CM | POA: Diagnosis not present

## 2019-10-03 ENCOUNTER — Emergency Department (HOSPITAL_COMMUNITY)
Admission: EM | Admit: 2019-10-03 | Discharge: 2019-10-04 | Disposition: A | Discharge status: Home / Self Care | Payer: BC Managed Care – PPO | Attending: Emergency Medicine | Admitting: Emergency Medicine

## 2019-10-03 ENCOUNTER — Other Ambulatory Visit (HOSPITAL_COMMUNITY): Payer: Self-pay | Admitting: Internal Medicine

## 2019-10-03 ENCOUNTER — Encounter (HOSPITAL_COMMUNITY): Payer: Self-pay | Admitting: Emergency Medicine

## 2019-10-03 ENCOUNTER — Other Ambulatory Visit: Payer: Self-pay

## 2019-10-03 ENCOUNTER — Ambulatory Visit (HOSPITAL_COMMUNITY)
Admission: RE | Admit: 2019-10-03 | Discharge: 2019-10-03 | Disposition: A | Discharge status: Home / Self Care | Payer: BC Managed Care – PPO | Source: Ambulatory Visit | Attending: Internal Medicine | Admitting: Internal Medicine

## 2019-10-03 DIAGNOSIS — Z87891 Personal history of nicotine dependence: Secondary | ICD-10-CM | POA: Diagnosis not present

## 2019-10-03 DIAGNOSIS — Z79899 Other long term (current) drug therapy: Secondary | ICD-10-CM | POA: Diagnosis not present

## 2019-10-03 DIAGNOSIS — U071 COVID-19: Secondary | ICD-10-CM | POA: Insufficient documentation

## 2019-10-03 DIAGNOSIS — I10 Essential (primary) hypertension: Secondary | ICD-10-CM | POA: Diagnosis not present

## 2019-10-03 DIAGNOSIS — J1282 Pneumonia due to coronavirus disease 2019: Secondary | ICD-10-CM

## 2019-10-03 DIAGNOSIS — R05 Cough: Secondary | ICD-10-CM | POA: Diagnosis present

## 2019-10-03 LAB — CBC WITH DIFFERENTIAL/PLATELET
Abs Immature Granulocytes: 0.01 10*3/uL (ref 0.00–0.07)
Basophils Absolute: 0 10*3/uL (ref 0.0–0.1)
Basophils Relative: 0 %
Eosinophils Absolute: 0 10*3/uL (ref 0.0–0.5)
Eosinophils Relative: 0 %
HCT: 48.2 % (ref 39.0–52.0)
Hemoglobin: 16.2 g/dL (ref 13.0–17.0)
Immature Granulocytes: 0 %
Lymphocytes Relative: 15 %
Lymphs Abs: 0.6 10*3/uL — ABNORMAL LOW (ref 0.7–4.0)
MCH: 30.6 pg (ref 26.0–34.0)
MCHC: 33.6 g/dL (ref 30.0–36.0)
MCV: 90.9 fL (ref 80.0–100.0)
Monocytes Absolute: 0.3 10*3/uL (ref 0.1–1.0)
Monocytes Relative: 7 %
Neutro Abs: 2.9 10*3/uL (ref 1.7–7.7)
Neutrophils Relative %: 78 %
Platelets: 134 10*3/uL — ABNORMAL LOW (ref 150–400)
RBC: 5.3 MIL/uL (ref 4.22–5.81)
RDW: 11.9 % (ref 11.5–15.5)
WBC: 3.8 10*3/uL — ABNORMAL LOW (ref 4.0–10.5)
nRBC: 0 % (ref 0.0–0.2)

## 2019-10-03 LAB — COMPREHENSIVE METABOLIC PANEL
ALT: 43 U/L (ref 0–44)
AST: 39 U/L (ref 15–41)
Albumin: 3.9 g/dL (ref 3.5–5.0)
Alkaline Phosphatase: 66 U/L (ref 38–126)
Anion gap: 10 (ref 5–15)
BUN: 8 mg/dL (ref 6–20)
CO2: 26 mmol/L (ref 22–32)
Calcium: 8.5 mg/dL — ABNORMAL LOW (ref 8.9–10.3)
Chloride: 100 mmol/L (ref 98–111)
Creatinine, Ser: 1.12 mg/dL (ref 0.61–1.24)
GFR calc Af Amer: >60 mL/min (ref >60)
GFR calc non Af Amer: >60 mL/min (ref >60)
Glucose, Bld: 123 mg/dL — ABNORMAL HIGH (ref 70–99)
Potassium: 3.8 mmol/L (ref 3.5–5.1)
Sodium: 136 mmol/L (ref 135–145)
Total Bilirubin: 0.8 mg/dL (ref 0.3–1.2)
Total Protein: 7.6 g/dL (ref 6.5–8.1)

## 2019-10-03 LAB — SARS CORONAVIRUS 2 BY RT PCR (HOSPITAL ORDER, PERFORMED IN ~~LOC~~ HOSPITAL LAB): SARS Coronavirus 2: POSITIVE — AB

## 2019-10-03 NOTE — ED Triage Notes (Signed)
Patient tested positive for COVID19 this week , reports loss of taste/smell , persistent dry cough/chest congestion and diarrhea , patient stated chest x-ray result today at [facility] shows pneumonia , denies fever or chills.

## 2019-10-04 MED ORDER — FAMOTIDINE IN NACL 20-0.9 MG/50ML-% IV SOLN: 20.0000 mg | Freq: Once | INTRAVENOUS | Status: DC | PRN

## 2019-10-04 MED ORDER — EPINEPHRINE 0.3 MG/0.3ML IJ SOAJ: 0.3000 mg | Freq: Once | INTRAMUSCULAR | Status: DC | PRN

## 2019-10-04 MED ORDER — SODIUM CHLORIDE 0.9 % IV SOLN: INTRAVENOUS | Status: DC | PRN

## 2019-10-04 MED ORDER — METHYLPREDNISOLONE SODIUM SUCC 125 MG IJ SOLR: 125.0000 mg | Freq: Once | INTRAMUSCULAR | Status: DC | PRN

## 2019-10-04 MED ORDER — ALBUTEROL SULFATE HFA 108 (90 BASE) MCG/ACT IN AERS: 2.0000 | INHALATION_SPRAY | Freq: Once | RESPIRATORY_TRACT | Status: DC | PRN

## 2019-10-04 MED ORDER — ACETAMINOPHEN 500 MG PO TABS
1000.0000 mg | ORAL_TABLET | Freq: Once | ORAL | Status: AC
  Administered 2019-10-04: 1000 mg via ORAL
  Filled 2019-10-04: qty 2

## 2019-10-04 MED ORDER — DIPHENHYDRAMINE HCL 50 MG/ML IJ SOLN: 50.0000 mg | Freq: Once | INTRAMUSCULAR | Status: DC | PRN

## 2019-10-04 MED ORDER — SODIUM CHLORIDE 0.9 % IV SOLN
1200.0000 mg | Freq: Once | INTRAVENOUS | Status: AC
  Administered 2019-10-04: 1200 mg via INTRAVENOUS
  Filled 2019-10-04: qty 10

## 2019-10-04 NOTE — ED Provider Notes (Signed)
Getting MAB infusion then home if VS ok.  Patient remained stable after Mab infusion.  Encouraged him to continue quarantine, Tylenol ibuprofen as needed for fever and ongoing hydration.  Given return precautions.   Gwyneth Sprout, MD 10/04/19 1406

## 2019-10-04 NOTE — ED Provider Notes (Signed)
[facility] EMERGENCY DEPARTMENT Provider Note   CSN: [id number] Arrival date & time: 10/03/19  1913     History Chief Complaint  Patient presents with  . COVID+ / Pneumonia    Tanner Wilson is a 44 y.o. male.  The history is provided by the patient.  Cough Severity:  Moderate Onset quality:  Gradual Timing:  Intermittent Progression:  Worsening Chronicity:  New Smoker: no   Relieved by:  Nothing Worsened by:  Nothing Associated symptoms: chills, fever, headaches and myalgias   Associated symptoms: no shortness of breath   Patient reports he began having cough and myalgias approximately 1 week ago.  He reports headache.  Reports chest soreness with cough. No hemoptysis.  No dyspnea on exertion.  He is a non-smoker.  He is not vaccinated for Covid. He is already tested positive prior to arrival.  He had an outpatient x-ray done by his PCP and was instructed to go to ER for pneumonia    PMH - hypertension, hyperlipidemia  Patient Active Problem List   Diagnosis Date Noted  . Metabolic syndrome 08/13/2015  . Hyperlipidemia 07/21/2015  . Essential hypertension 07/16/2015  . Allergy to meat 07/16/2015    Past Surgical History:  Procedure Laterality Date  . CARPAL TUNNEL RELEASE Bilateral 2005       Family History  Problem Relation Age of Onset  . Heart disease Father     Social History   Tobacco Use  . Smoking status: Former Smoker    Packs/day: 1.00    Types: Cigarettes    Quit date: 02/28/1998    Years since quitting: 21.6  Substance Use Topics  . Alcohol use: No  . Drug use: No    Home Medications Prior to Admission medications   Medication Sig Start Date End Date Taking? Authorizing Provider  atorvastatin (LIPITOR) 20 MG tablet Take 1 tablet (20 mg total) by mouth daily. 08/13/15   Jannifer Rodney A, FNP  EPINEPHrine 0.3 mg/0.3 mL IJ SOAJ injection Inject 0.3 mLs (0.3 mg total) into the muscle once. 08/13/15   Jannifer Rodney A, FNP   lisinopril-hydrochlorothiazide (ZESTORETIC) 20-12.5 MG tablet Take 1 tablet by mouth daily. 08/13/15   Junie Spencer, FNP    Allergies    Patient has no known allergies.  Review of Systems   Review of Systems  Constitutional: Positive for chills and fever.  Respiratory: Positive for cough. Negative for shortness of breath.   Musculoskeletal: Positive for myalgias.  Neurological: Positive for headaches.  All other systems reviewed and are negative.   Physical Exam Updated Vital Signs BP 127/78 (BP Location: Left Arm)   Pulse 81   Temp 99.5 F (37.5 C) (Oral)   Resp 17   Ht 1.829 m (6')   Wt 135 kg   SpO2 93%   BMI 40.36 kg/m   Physical Exam CONSTITUTIONAL: Well developed/well nourished HEAD: Normocephalic/atraumatic EYES: EOMI ENMT: Mask in place NECK: supple no meningeal signs SPINE/BACK:entire spine nontender CV: S1/S2 noted, no murmurs/rubs/gallops noted LUNGS: Lungs are clear to auscultation bilaterally, no apparent distress ABDOMEN: soft, nontender, no rebound or guarding, bowel sounds noted throughout abdomen GU:no cva tenderness NEURO: Pt is awake/alert/appropriate, moves all extremitiesx4.  No facial droop.   EXTREMITIES: pulses normal/equal, full ROM SKIN: warm, color normal PSYCH: no abnormalities of mood noted, alert and oriented to situation  ED Results / Procedures / Treatments   Labs (all labs ordered are listed, but only abnormal results are displayed) Labs Reviewed  SARS CORONAVIRUS 2 BY RT PCR (HOSPITAL ORDER, PERFORMED IN [facility]) - Abnormal; Notable for the following components:      Result Value   SARS Coronavirus 2 POSITIVE (*)    All other components within normal limits  CBC WITH DIFFERENTIAL/PLATELET - Abnormal; Notable for the following components:   WBC 3.8 (*)    Platelets 134 (*)    Lymphs Abs 0.6 (*)    All other components within normal limits  COMPREHENSIVE METABOLIC PANEL - Abnormal; Notable for the following  components:   Glucose, Bld 123 (*)    Calcium 8.5 (*)    All other components within normal limits    EKG None  Radiology DG Chest 2 View  Result Date: 10/03/2019 CLINICAL DATA:  Dyspnea, cough, COVID pneumonia EXAM: CHEST - 2 VIEW COMPARISON:  None. FINDINGS: The lungs are well expanded and are symmetric. Mild asymmetric left perihilar pulmonary infiltrate is present, likely infectious or inflammatory in the acute setting. No pneumothorax or pleural effusion. Cardiac size within normal limits. The pulmonary vascularity is normal. No acute bone abnormality. IMPRESSION: Mild asymmetric left perihilar pulmonary infiltrate, likely infectious or inflammatory in the acute setting. Electronically Signed   By: Helyn Numbers MD   On: 10/03/2019 17:23    Procedures Procedures Medications Ordered in ED Medications  casirivimab-imdevimab (REGEN-COV) 1,200 mg in sodium chloride 0.9 % 110 mL IVPB (has no administration in time range)  0.9 %  sodium chloride infusion (has no administration in time range)  diphenhydrAMINE (BENADRYL) injection 50 mg (has no administration in time range)  famotidine (PEPCID) IVPB 20 mg premix (has no administration in time range)  methylPREDNISolone sodium succinate (SOLU-MEDROL) 125 mg/2 mL injection 125 mg (has no administration in time range)  albuterol (VENTOLIN HFA) 108 (90 Base) MCG/ACT inhaler 2 puff (has no administration in time range)  EPINEPHrine (EPI-PEN) injection 0.3 mg (has no administration in time range)    ED Course  I have reviewed the triage vital signs and the nursing notes.  Pertinent labs & imaging results that were available during my care of the patient were reviewed by me and considered in my medical decision making (see chart for details).    MDM Rules/Calculators/A&P                          7:51 AM Patient with symptoms of COVID-19 for approximately 1 week.  He is currently in no acute distress, no hypoxia.  I have reviewed the chest  x-ray and he has mild signs of pneumonia.  I feel he is a good candidate for monoclonal antibody infusions Patient is agreeable with plan. Signed out to Dr. Anitra Lauth at shift change to reassess after MAB infusion  Tanner Wilson was evaluated in Emergency Department on 10/04/2019 for the symptoms described in the history of present illness. He was evaluated in the context of the global COVID-19 pandemic, which necessitated consideration that the patient might be at risk for infection with the SARS-CoV-2 virus that causes COVID-19. Institutional protocols and algorithms that pertain to the evaluation of patients at risk for COVID-19 are in a state of rapid change based on information released by regulatory bodies including the CDC and federal and state organizations. These policies and algorithms were followed during the patient's care in the ED.  Final Clinical Impression(s) / ED Diagnoses Final diagnoses:  COVID-19    Rx / DC Orders ED Discharge Orders    None  Zadie Rhine, MD 10/04/19 [phone]

## 2019-10-04 NOTE — ED Notes (Signed)
Pt signed paper consent for infusion.

## 2019-10-04 NOTE — Discharge Instructions (Signed)
Make sure you are staying home and quarantining.  Make sure that you are drinking plenty of fluids and using Tylenol or ibuprofen for your fever.  If you start having severe shortness of breath and you cannot catch her breath even with sitting down and resting you need to return to the emergency room.

## 2019-10-04 NOTE — ED Notes (Signed)
Patient verbalizes understanding of discharge instructions. Opportunity for questioning and answers were provided. Armband removed by staff, pt discharged from ED ambulatory.   

## 2021-12-18 IMAGING — CR DG CHEST 2V
2 series · 2 of 2 positions shown · non-contrast
Comparison: None.

CLINICAL DATA: Dyspnea, cough, COVID pneumonia

EXAM:
CHEST - 2 VIEW

[w pa chest]
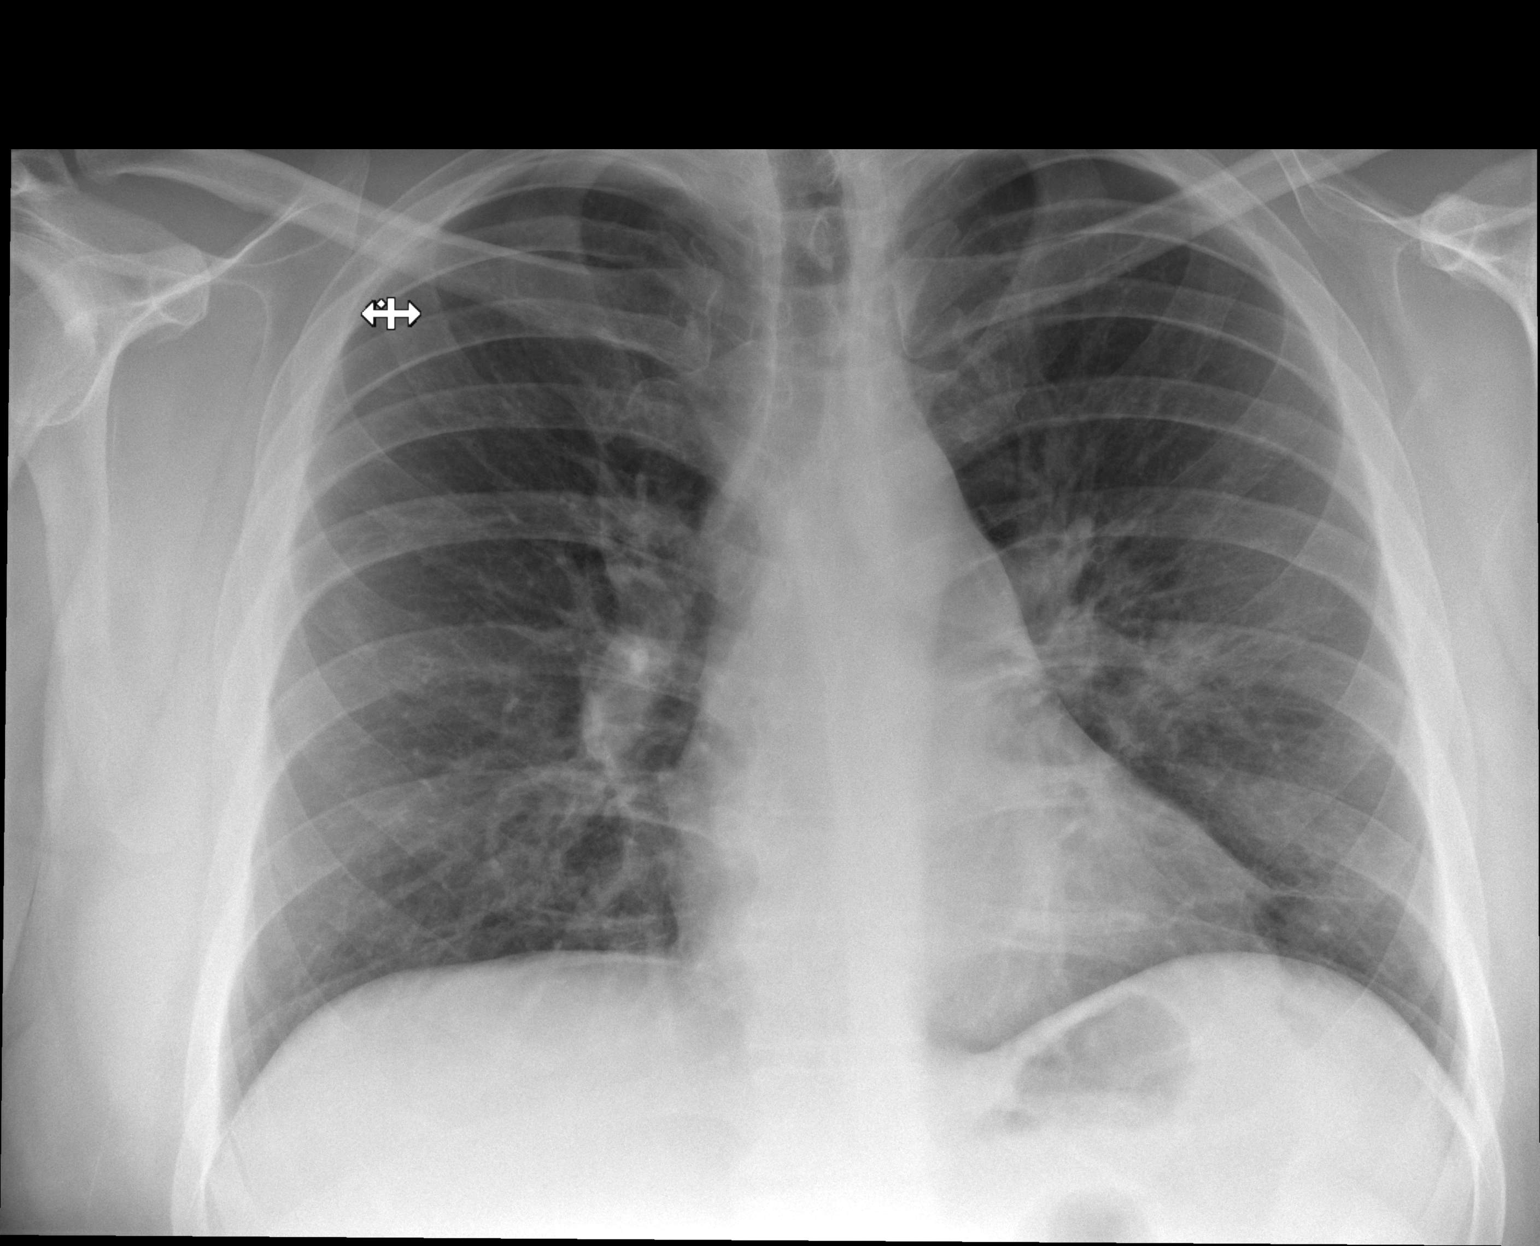

[w chest lat]
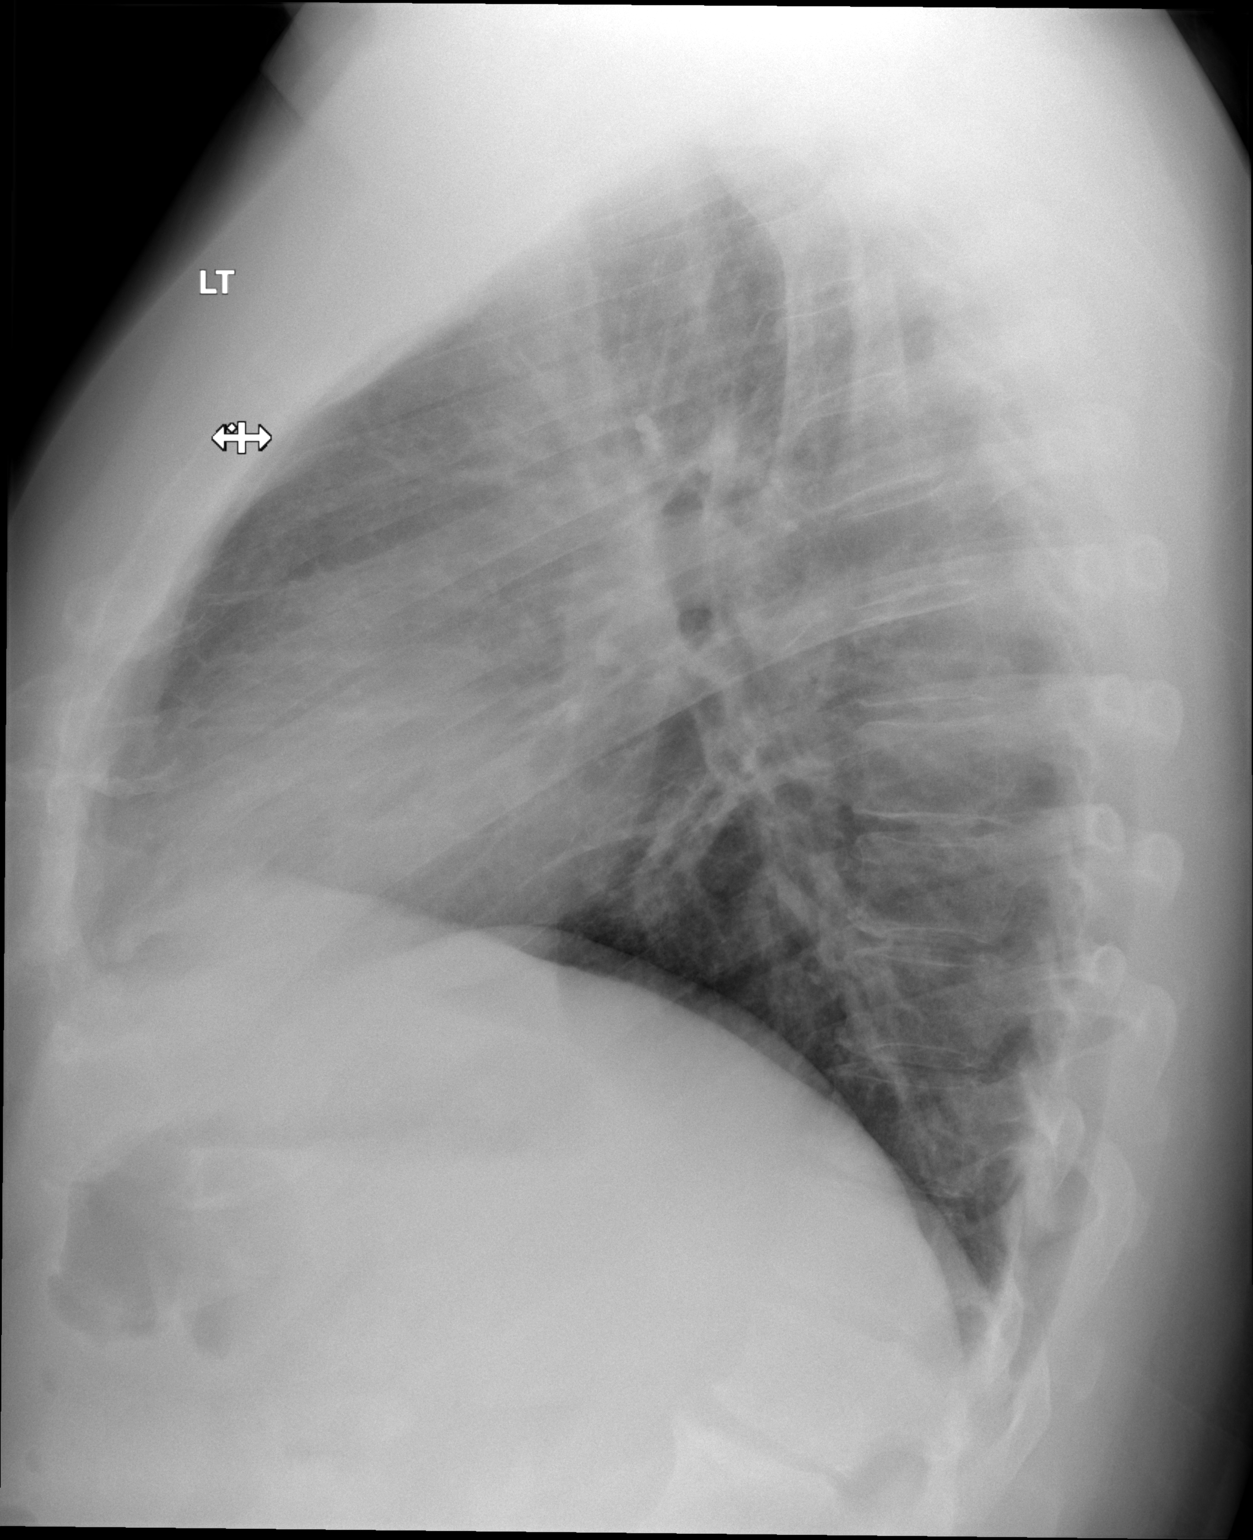

[2 of 2 positions shown; findings below may reference images not displayed]

FINDINGS: The lungs are well expanded and are symmetric. Mild asymmetric left
perihilar pulmonary infiltrate is present, likely infectious or
inflammatory in the acute setting. No pneumothorax or pleural
effusion. Cardiac size within normal limits. The pulmonary
vascularity is normal. No acute bone abnormality.
IMPRESSION: Mild asymmetric left perihilar pulmonary infiltrate, likely
infectious or inflammatory in the acute setting.

## 2022-03-02 ENCOUNTER — Encounter: Payer: Self-pay | Admitting: Emergency Medicine

## 2022-03-02 ENCOUNTER — Ambulatory Visit
Admission: EM | Admit: 2022-03-02 | Discharge: 2022-03-02 | Disposition: A | Discharge status: Home / Self Care | Payer: BC Managed Care – PPO | Attending: Family Medicine | Admitting: Family Medicine

## 2022-03-02 DIAGNOSIS — S39012A Strain of muscle, fascia and tendon of lower back, initial encounter: Secondary | ICD-10-CM

## 2022-03-02 DIAGNOSIS — M5431 Sciatica, right side: Secondary | ICD-10-CM

## 2022-03-02 DIAGNOSIS — R030 Elevated blood-pressure reading, without diagnosis of hypertension: Secondary | ICD-10-CM

## 2022-03-02 HISTORY — DX: Essential (primary) hypertension: I10

## 2022-03-02 HISTORY — DX: Pure hypercholesterolemia, unspecified: E78.00

## 2022-03-02 MED ORDER — TIZANIDINE HCL 4 MG PO CAPS: 4.0000 mg | ORAL_CAPSULE | Freq: Three times a day (TID) | ORAL | 0 refills | Status: DC | PRN

## 2022-03-02 MED ORDER — DEXAMETHASONE SODIUM PHOSPHATE 10 MG/ML IJ SOLN
10.0000 mg | Freq: Once | INTRAMUSCULAR | Status: AC
  Administered 2022-03-02: 10 mg via INTRAMUSCULAR

## 2022-03-02 NOTE — ED Provider Notes (Signed)
[facility]    CSN: [id number] Arrival date & time: 03/02/22  1911      History   Chief Complaint No chief complaint on file.   HPI Tanner Wilson is a 47 y.o. male.   Patient presenting today with 1 day history of right lateral low back pain and stiffness radiating down the right leg since lifting a heavy tire this morning.  He states he is able to get the pain to go away when he relaxes in certain positions but weightbearing and straightening his leg make the pain worse.  Denies bowel or bladder incontinence, saddle anesthesias, numbness tingling weakness of the legs, midline back pain.  Has taken ibuprofen twice since incident occurred with minimal improvement in pain symptoms.  Denies any past history of similar injuries.    Past Medical History:  Diagnosis Date   High cholesterol    Hypertension     Patient Active Problem List   Diagnosis Date Noted   Metabolic syndrome 42/35/3614   Hyperlipidemia 07/21/2015   Essential hypertension 07/16/2015   Allergy to meat 07/16/2015    Past Surgical History:  Procedure Laterality Date   BACK SURGERY     CARPAL TUNNEL RELEASE Bilateral 01/31/2003       Home Medications    Prior to Admission medications   Medication Sig Start Date End Date Taking? Authorizing Provider  tiZANidine (ZANAFLEX) 4 MG capsule Take 1 capsule (4 mg total) by mouth 3 (three) times daily as needed for muscle spasms. Do not drink alcohol or drive while taking this medication. May cause drowsiness 03/02/22  Yes Volney American, PA-C  atorvastatin (LIPITOR) 20 MG tablet Take 1 tablet (20 mg total) by mouth daily. 08/13/15   [name] A, FNP  EPINEPHrine 0.3 mg/0.3 mL IJ SOAJ injection Inject 0.3 mLs (0.3 mg total) into the muscle once. 08/13/15   [name] A, FNP  lisinopril-hydrochlorothiazide (ZESTORETIC) 20-12.5 MG tablet Take 1 tablet by mouth daily. 08/13/15   Sharion Balloon, FNP    Family History Family History   Problem Relation Age of Onset   Heart disease Father     Social History Social History   Tobacco Use   Smoking status: Former    Packs/day: 1.00    Types: Cigarettes    Quit date: 02/28/1998    Years since quitting: 24.0  Substance Use Topics   Alcohol use: No   Drug use: No     Allergies   Alpha-gal   Review of Systems Review of Systems Per HPI  Physical Exam Triage Vital Signs ED Triage Vitals  Enc Vitals Group     BP 03/02/22 1918 (!) 170/117     Pulse Rate 03/02/22 1918 88     Resp 03/02/22 1918 20     Temp 03/02/22 1918 98.7 F (37.1 C)     Temp Source 03/02/22 1918 Oral     SpO2 03/02/22 1918 96 %     Weight --      Height --      Head Circumference --      Peak Flow --      Pain Score 03/02/22 1922 9     Pain Loc --      Pain Edu? --      Excl. in [place]? --    No data found.  Updated Vital Signs BP (!) 170/117 (BP Location: Right Arm)   Pulse 88   Temp 98.7 F (37.1 C) (Oral)   Resp  20   SpO2 96%   Visual Acuity Right Eye Distance:   Left Eye Distance:   Bilateral Distance:    Right Eye Near:   Left Eye Near:    Bilateral Near:     Physical Exam Vitals and nursing note reviewed.  Constitutional:      Appearance: Normal appearance.  HENT:     Head: Atraumatic.     Mouth/Throat:     Mouth: Mucous membranes are moist.     Pharynx: Oropharynx is clear.  Eyes:     Extraocular Movements: Extraocular movements intact.     Conjunctiva/sclera: Conjunctivae normal.  Cardiovascular:     Rate and Rhythm: Normal rate and regular rhythm.  Pulmonary:     Effort: Pulmonary effort is normal.     Breath sounds: Normal breath sounds.  Musculoskeletal:        General: Tenderness and signs of injury present. No swelling or deformity. Normal range of motion.     Cervical back: Normal range of motion and neck supple.     Comments: No midline spinal tenderness to palpation diffusely.  Negative straight leg raise bilateral lower extremities.   Localized tenderness to palpation to lateral lumbar musculature  Skin:    General: Skin is warm and dry.     Findings: No bruising or erythema.  Neurological:     General: No focal deficit present.     Mental Status: He is oriented to person, place, and time.     Motor: No weakness.     Gait: Gait normal.     Comments: Bilateral lower extremities neurovascularly intact  Psychiatric:        Mood and Affect: Mood normal.        Thought Content: Thought content normal.        Judgment: Judgment normal.     UC Treatments / Results  Labs (all labs ordered are listed, but only abnormal results are displayed) Labs Reviewed - No data to display  EKG   Radiology No results found.  Procedures Procedures (including critical care time)  Medications Ordered in UC Medications  dexamethasone (DECADRON) injection 10 mg (10 mg Intramuscular Given 03/02/22 1952)    Initial Impression / Assessment and Plan / UC Course  I have reviewed the triage vital signs and the nursing notes.  Pertinent labs & imaging results that were available during my care of the patient were reviewed by me and considered in my medical decision making (see chart for details).     Exam and vital signs very reassuring today, incredibly low suspicion for any sort of bony injury as this is very consistent with a muscular strain causing sciatica so x-ray imaging deferred at this time.  As he is already taking multiple doses of NSAIDs today, will treat with IM Decadron, Zanaflex as needed, heat, massage, rest and over-the-counter pain relievers as needed.  Handout given on sciatica and orthopedic follow-up given in case not improving.  Work note given if needed.  ED for red flag symptoms such as bowel or bladder incontinence or severe weakness of the legs. Also noted to have elevated blood pressure reading today in triage, likely secondary to his pain.  Discussed to continue to monitor this, and that it would likely improve  back to baseline once pain is under good control.  Currently not having any chest pain, shortness of breath, headaches, dizziness, palpitations.  Final Clinical Impressions(s) / UC Diagnoses   Final diagnoses:  Right sided sciatica  Elevated blood  pressure reading  Strain of lumbar region, initial encounter     Discharge Instructions      We have given you a steroid injection today to help with pain and inflammation.  I have sent a muscle relaxer to your pharmacy to be used as needed, be cautious as this will cause drowsiness which is also written on the bottle instructions.  I have attached a handout with more information about sciatica and recommend warm Epsom salt soaks, heating pads, stretches, massages and rest.  I have also placed orthopedic resources if not feeling better over the next week or so.    ED Prescriptions     Medication Sig Dispense Auth. Provider   tiZANidine (ZANAFLEX) 4 MG capsule Take 1 capsule (4 mg total) by mouth 3 (three) times daily as needed for muscle spasms. Do not drink alcohol or drive while taking this medication. May cause drowsiness 15 capsule Volney American, [place]      PDMP not reviewed this encounter.   Volney American, [place] 03/02/22 1955

## 2022-03-02 NOTE — Discharge Instructions (Addendum)
We have given you a steroid injection today to help with pain and inflammation.  I have sent a muscle relaxer to your pharmacy to be used as needed, be cautious as this will cause drowsiness which is also written on the bottle instructions.  I have attached a handout with more information about sciatica and recommend warm Epsom salt soaks, heating pads, stretches, massages and rest.  I have also placed orthopedic resources if not feeling better over the next week or so.

## 2022-03-02 NOTE — ED Triage Notes (Signed)
Picked up a tire at work today and felt pain afterwards in lower back.  Pain shoots down right leg.

## 2022-03-23 ENCOUNTER — Ambulatory Visit: Payer: Self-pay | Admitting: Orthopedic Surgery

## 2022-03-23 DIAGNOSIS — M5126 Other intervertebral disc displacement, lumbar region: Secondary | ICD-10-CM

## 2022-03-23 NOTE — H&P (View-Only) (Signed)
Tanner Wilson is an 47 y.o. male.   Chief Complaint: back and right leg pain HPI: Reason for Visit: (normal) visit for: (back) Context: work injury; 20 days 03/02/22 Location (Lower Extremity): lower back pain Severity: pain level 8/10; moderate Quality: sharp; aching; burning Aggravating Factors: standing for long periods of time; walking for long periods of time; sitting for long periods of time Alleviating Factors: narcotics Associated Symptoms: numbness/tingling Are you working? not at all Medications: helping a little; Oxycodone  Past Medical History:  Diagnosis Date   High cholesterol    Hypertension     Past Surgical History:  Procedure Laterality Date   BACK SURGERY     CARPAL TUNNEL RELEASE Bilateral 01/31/2003    Family History  Problem Relation Age of Onset   Heart disease Father    Social History:  reports that he quit smoking about 24 years ago. His smoking use included cigarettes. He smoked an average of 1 pack per day. He does not have any smokeless tobacco history on file. He reports that he does not drink alcohol and does not use drugs.  Allergies:  Allergies  Allergen Reactions   Alpha-Gal     Current meds: atorvastatin 20 mg tablet gabapentin 300 mg capsule lisinopriL 20 mg-hydrochlorothiazide 12.5 mg tablet methocarbamoL 500 mg tablet oxyCODONE-acetaminophen 5 mg-325 mg tablet predniSONE 10 mg tablet tiZANidine 4 mg capsule traMADoL 50 mg tablet  Review of Systems  Constitutional: Negative.   HENT: Negative.    Eyes: Negative.   Respiratory: Negative.    Cardiovascular: Negative.   Gastrointestinal: Negative.   Endocrine: Negative.   Genitourinary: Negative.   Musculoskeletal:  Positive for back pain and gait problem.  Skin: Negative.   Neurological:  Positive for weakness and numbness.  Psychiatric/Behavioral: Negative.      There were no vitals taken for this visit. Physical Exam Constitutional:      Appearance: Normal  appearance.  HENT:     Head: Normocephalic and atraumatic.     Right Ear: External ear normal.     Left Ear: External ear normal.     Nose: Nose normal.     Mouth/Throat:     Pharynx: Oropharynx is clear.  Eyes:     Conjunctiva/sclera: Conjunctivae normal.  Cardiovascular:     Rate and Rhythm: Normal rate and regular rhythm.     Pulses: Normal pulses.  Pulmonary:     Effort: Pulmonary effort is normal.  Abdominal:     General: Bowel sounds are normal.  Musculoskeletal:     Cervical back: Normal range of motion.     Comments: Straight leg raise buttock pain In the right negative on the left. 3/5 dorsiflexion right compared to left altered sensation L5 dermatome  Skin:    General: Skin is warm and dry.  Neurological:     Mental Status: He is alert.    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 This space narrowing L4-5 no instability in flexion extension. Laminotomy at L4-5  MRI demonstrates a disc herniation paracentral to the right compressing the L5 nerve root.  Assessment/Plan Impression:  1. 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 Hypertension likely increased due to the prednisone and pain.  Plan:  Given the presence of a large neural compressive lesion and with a neurologic deficit we discussed a microdiscectomy. I had an extensive discussion with the patient concerning the pathology relevant anatomy and  treatment options. At this point exhausting conservative treatment and in the presence of a neurologic deficit we discussed microlumbar decompression. I discussed the risks and benefits including bleeding, infection, DVT, PE, anesthetic complications, worsening in their symptoms, improvement in their symptoms, C SF leakage, epidural fibrosis, need for future surgeries such as revision discectomy and lumbar fusion. I also indicated that  this is an operation to basically decompress the nerve roots to allow recovery as opposed to fixing a herniated disc if it is encountered and that the incidence of recurrent chest disc herniation can approach 15%. Also that nerve root recovery is variable and may not recover completely. Any ligament or bone that is contributing to compressing the nerves will be removed as well.  I discussed the operative course including overnight in the hospital. Immediate ambulation. Follow-up in 2 weeks for suture removal. 6 weeks until healing of the herniation and surgical incision followed by 6 weeks of reconditioning and strengthening of the core musculature. Also discussed the need to employ the concepts of disc pressure management and core motion following the surgery to minimize the risk of recurrent disc herniation. We will obtain preoperative clearance i if necessary and proceed accordingly. A prescription for an opioid previously was given to be taken as directed for pain control. I discussed the risks including cognitive changes which may affect the ability to operate machinery and to drive. In addition the side effect of constipation as well as to avoid taking with conflicting medications that were discussed. The patient's prescription drug monitoring report was reviewed with no red flags noted.  Patient is to call or present to the emergency room if there is any numbness or weakness to suggest worsening of their condition. In addition although rare if there is loss of bowel or bladder function such as incontinence or inability to void that this may represent a cauda equina syndrome. If noted the patient is to present immediately to the emergency room for evaluation and treatment otherwise permanent bowel or bladder dysfunction can occur as a result.  They had sent me a previous operative report. I reviewed the operative report. His microdiscectomy was at L4-5 on the left.  In addition I discussed this case  with the radiologist looking at his MRI. And confirmed a left-sided hemilaminotomy.  Plan laminotomy and microdiscectomy L4-5 right  Cecilie Kicks, PA-C for Dr Tonita Cong 03/23/2022, 1:25 PM

## 2022-03-23 NOTE — H&P (Signed)
Tanner Wilson is an 47 y.o. male.   Chief Complaint: back and right leg pain HPI: Reason for Visit: (normal) visit for: (back) Context: work injury; 20 days 03/02/22 Location (Lower Extremity): lower back pain Severity: pain level 8/10; moderate Quality: sharp; aching; burning Aggravating Factors: standing for long periods of time; walking for long periods of time; sitting for long periods of time Alleviating Factors: narcotics Associated Symptoms: numbness/tingling Are you working? not at all Medications: helping a little; Oxycodone  Past Medical History:  Diagnosis Date   High cholesterol    Hypertension     Past Surgical History:  Procedure Laterality Date   BACK SURGERY     CARPAL TUNNEL RELEASE Bilateral 01/31/2003    Family History  Problem Relation Age of Onset   Heart disease Father    Social History:  reports that he quit smoking about 24 years ago. His smoking use included cigarettes. He smoked an average of 1 pack per day. He does not have any smokeless tobacco history on file. He reports that he does not drink alcohol and does not use drugs.  Allergies:  Allergies  Allergen Reactions   Alpha-Gal     Current meds: atorvastatin 20 mg tablet gabapentin 300 mg capsule lisinopriL 20 mg-hydrochlorothiazide 12.5 mg tablet methocarbamoL 500 mg tablet oxyCODONE-acetaminophen 5 mg-325 mg tablet predniSONE 10 mg tablet tiZANidine 4 mg capsule traMADoL 50 mg tablet  Review of Systems  Constitutional: Negative.   HENT: Negative.    Eyes: Negative.   Respiratory: Negative.    Cardiovascular: Negative.   Gastrointestinal: Negative.   Endocrine: Negative.   Genitourinary: Negative.   Musculoskeletal:  Positive for back pain and gait problem.  Skin: Negative.   Neurological:  Positive for weakness and numbness.  Psychiatric/Behavioral: Negative.      There were no vitals taken for this visit. Physical Exam Constitutional:      Appearance: Normal  appearance.  HENT:     Head: Normocephalic and atraumatic.     Right Ear: External ear normal.     Left Ear: External ear normal.     Nose: Nose normal.     Mouth/Throat:     Pharynx: Oropharynx is clear.  Eyes:     Conjunctiva/sclera: Conjunctivae normal.  Cardiovascular:     Rate and Rhythm: Normal rate and regular rhythm.     Pulses: Normal pulses.  Pulmonary:     Effort: Pulmonary effort is normal.  Abdominal:     General: Bowel sounds are normal.  Musculoskeletal:     Cervical back: Normal range of motion.     Comments: Straight leg raise buttock pain In the right negative on the left. 3/5 dorsiflexion right compared to left altered sensation L5 dermatome  Skin:    General: Skin is warm and dry.  Neurological:     Mental Status: He is alert.    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 This space narrowing L4-5 no instability in flexion extension. Laminotomy at L4-5  MRI demonstrates a disc herniation paracentral to the right compressing the L5 nerve root.  Assessment/Plan Impression:  1. 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 Hypertension likely increased due to the prednisone and pain.  Plan:  Given the presence of a large neural compressive lesion and with a neurologic deficit we discussed a microdiscectomy. I had an extensive discussion with the patient concerning the pathology relevant anatomy and  treatment options. At this point exhausting conservative treatment and in the presence of a neurologic deficit we discussed microlumbar decompression. I discussed the risks and benefits including bleeding, infection, DVT, PE, anesthetic complications, worsening in their symptoms, improvement in their symptoms, C SF leakage, epidural fibrosis, need for future surgeries such as revision discectomy and lumbar fusion. I also indicated that  this is an operation to basically decompress the nerve roots to allow recovery as opposed to fixing a herniated disc if it is encountered and that the incidence of recurrent chest disc herniation can approach 15%. Also that nerve root recovery is variable and may not recover completely. Any ligament or bone that is contributing to compressing the nerves will be removed as well.  I discussed the operative course including overnight in the hospital. Immediate ambulation. Follow-up in 2 weeks for suture removal. 6 weeks until healing of the herniation and surgical incision followed by 6 weeks of reconditioning and strengthening of the core musculature. Also discussed the need to employ the concepts of disc pressure management and core motion following the surgery to minimize the risk of recurrent disc herniation. We will obtain preoperative clearance i if necessary and proceed accordingly. A prescription for an opioid previously was given to be taken as directed for pain control. I discussed the risks including cognitive changes which may affect the ability to operate machinery and to drive. In addition the side effect of constipation as well as to avoid taking with conflicting medications that were discussed. The patient's prescription drug monitoring report was reviewed with no red flags noted.  Patient is to call or present to the emergency room if there is any numbness or weakness to suggest worsening of their condition. In addition although rare if there is loss of bowel or bladder function such as incontinence or inability to void that this may represent a cauda equina syndrome. If noted the patient is to present immediately to the emergency room for evaluation and treatment otherwise permanent bowel or bladder dysfunction can occur as a result.  They had sent me a previous operative report. I reviewed the operative report. His microdiscectomy was at L4-5 on the left.  In addition I discussed this case  with the radiologist looking at his MRI. And confirmed a left-sided hemilaminotomy.  Plan laminotomy and microdiscectomy L4-5 right  Cecilie Kicks, PA-C for Dr Tonita Cong 03/23/2022, 1:25 PM

## 2022-03-30 NOTE — Progress Notes (Signed)
Surgical Instructions    Your procedure is scheduled on Thursday, April 06, 2022.  Report to [facility] Main Entrance "A" at 8:30 A.M., then check in with the Admitting office.  Call this number if you have problems the morning of surgery:  [phone]   If you have any questions prior to your surgery date call [phone]: Open Monday-Friday 8am-4pm If you experience any cold or flu symptoms such as cough, fever, chills, shortness of breath, etc. between now and your scheduled surgery, please notify us at the above number     Remember:  Do not eat after midnight the night before your surgery  You may drink clear liquids until 7:30 the morning of your surgery.   Clear liquids allowed are: Water, Non-Citrus Juices (without pulp), Carbonated Beverages, Clear Tea, Black Coffee ONLY (NO MILK, CREAM OR POWDERED CREAMER of any kind), and Gatorade  Patient Instructions  The night before surgery:  No food after midnight. ONLY clear liquids after midnight  The day of surgery (if you do NOT have diabetes):  Drink ONE (1) Pre-Surgery Clear Ensure by 7:30 the morning of surgery. Drink in one sitting. Do not sip.  This drink was given to you during your hospital  pre-op appointment visit.  Nothing else to drink after completing the  Pre-Surgery Clear Ensure.          If you have questions, please contact your surgeon's office.    Take these medicines the morning of surgery with A SIP OF WATER:  atorvastatin (LIPITOR)  gabapentin (NEURONTIN)    As Needed: oxyCODONE-acetaminophen (PERCOCET/ROXICET)  tiZANidine (ZANAFLEX)     As of today, STOP taking any Aspirin (unless otherwise instructed by your surgeon) Aleve, Naproxen, Ibuprofen, Motrin, Advil, Goody's, BC's, all herbal medications, fish oil, and all vitamins.           Do not wear jewelry  Do not wear lotions, powders, cologne or deodorant.  Men may shave face and neck. Do not bring valuables to the hospital.   [facility] is not responsible for any belongings or valuables.    Do NOT Smoke (Tobacco/Vaping)  24 hours prior to your procedure  If you use a CPAP at night, you may bring your mask for your overnight stay.   Contacts, glasses, hearing aids, dentures or partials may not be worn into surgery, please bring cases for these belongings   For patients admitted to the hospital, discharge time will be determined by your treatment team.   Patients discharged the day of surgery will not be allowed to drive home, and someone needs to stay with them for 24 hours.   SURGICAL WAITING ROOM VISITATION Patients having surgery or a procedure may have no more than 2 support people in the waiting area - these visitors may rotate.   Children under the age of 91 must have an adult with them who is not the patient. If the patient needs to stay at the hospital during part of their recovery, the visitor guidelines for inpatient rooms apply. Pre-op nurse will coordinate an appropriate time for 1 support person to accompany patient in pre-op.  This support person may not rotate.   Please refer to [email or web address] for the visitor guidelines for Inpatients (after your surgery is over and you are in a regular room).    Special instructions:    Oral Hygiene is also important to reduce your risk of infection.  Remember - BRUSH YOUR TEETH THE MORNING OF SURGERY WITH YOUR REGULAR TOOTHPASTE  [place]- Preparing For Surgery  Before surgery, you can play an important role. Because skin is not sterile, your skin needs to be as free of germs as possible. You can reduce the number of germs on your skin by washing with CHG (chlorahexidine gluconate) Soap before surgery.  CHG is an antiseptic cleaner which kills germs and bonds with the skin to continue killing germs even after washing.     Please do not use if you have an allergy to CHG or antibacterial soaps. If your  skin becomes reddened/irritated stop using the CHG.  Do not shave (including legs and underarms) for at least 48 hours prior to first CHG shower. It is OK to shave your face.  Please follow these instructions carefully.     Shower the NIGHT BEFORE SURGERY and the MORNING OF SURGERY with CHG Soap.   If you chose to wash your hair, wash your hair first as usual with your normal shampoo. After you shampoo, rinse your hair and body thoroughly to remove the shampoo.  Then [employer] and genitals (private parts) with your normal soap and rinse thoroughly to remove soap.  After that Use CHG Soap as you would any other liquid soap. You can apply CHG directly to the skin and wash gently with a scrungie or a clean washcloth.   Apply the CHG Soap to your body ONLY FROM THE NECK DOWN.  Do not use on open wounds or open sores. Avoid contact with your eyes, ears, mouth and genitals (private parts). Wash Face and genitals (private parts)  with your normal soap.   Wash thoroughly, paying special attention to the area where your surgery will be performed.  Thoroughly rinse your body with warm water from the neck down.  DO NOT shower/wash with your normal soap after using and rinsing off the CHG Soap.  Pat yourself dry with a CLEAN TOWEL.  Wear CLEAN PAJAMAS to bed the night before surgery  Place CLEAN SHEETS on your bed the night before your surgery  DO NOT SLEEP WITH PETS.   Day of Surgery:  Take a shower with CHG soap. Wear Clean/Comfortable clothing the morning of surgery Do not apply any deodorants/lotions.   Remember to brush your teeth WITH YOUR REGULAR TOOTHPASTE.    If you received a COVID test during your pre-op visit, it is requested that you wear a mask when out in public, stay away from anyone that may not be feeling well, and notify your surgeon if you develop symptoms. If you have been in contact with anyone that has tested positive in the last 10 days, please notify your  surgeon.    Please read over the following fact sheets that you were given.

## 2022-03-31 ENCOUNTER — Ambulatory Visit (HOSPITAL_COMMUNITY)
Admission: RE | Admit: 2022-03-31 | Discharge: 2022-03-31 | Disposition: A | Discharge status: Home / Self Care | Payer: BC Managed Care – PPO | Source: Ambulatory Visit | Attending: Orthopedic Surgery | Admitting: Orthopedic Surgery

## 2022-03-31 ENCOUNTER — Encounter (HOSPITAL_COMMUNITY): Payer: Self-pay

## 2022-03-31 ENCOUNTER — Other Ambulatory Visit: Payer: Self-pay

## 2022-03-31 ENCOUNTER — Encounter (HOSPITAL_COMMUNITY)
Admission: RE | Admit: 2022-03-31 | Discharge: 2022-03-31 | Disposition: A | Discharge status: Home / Self Care | Payer: Worker's Compensation | Source: Ambulatory Visit | Attending: Specialist | Admitting: Specialist

## 2022-03-31 VITALS — BP 136/86 | HR 82 | Temp 98.4°F | Resp 19 | Ht 74.0 in | Wt 287.3 lb

## 2022-03-31 DIAGNOSIS — I10 Essential (primary) hypertension: Secondary | ICD-10-CM | POA: Insufficient documentation

## 2022-03-31 DIAGNOSIS — Z01818 Encounter for other preprocedural examination: Secondary | ICD-10-CM | POA: Diagnosis present

## 2022-03-31 DIAGNOSIS — M5126 Other intervertebral disc displacement, lumbar region: Secondary | ICD-10-CM | POA: Diagnosis not present

## 2022-03-31 HISTORY — DX: Other specified postprocedural states: R11.2

## 2022-03-31 HISTORY — DX: Other specified postprocedural states: Z98.890

## 2022-03-31 HISTORY — DX: Other complications of anesthesia, initial encounter: T88.59XA

## 2022-03-31 LAB — BASIC METABOLIC PANEL
Anion gap: 7 (ref 5–15)
BUN: 13 mg/dL (ref 6–20)
CO2: 29 mmol/L (ref 22–32)
Calcium: 9 mg/dL (ref 8.9–10.3)
Chloride: 98 mmol/L (ref 98–111)
Creatinine, Ser: 0.95 mg/dL (ref 0.61–1.24)
GFR, Estimated: >60 mL/min (ref >60)
Glucose, Bld: 112 mg/dL — ABNORMAL HIGH (ref 70–99)
Potassium: 3.8 mmol/L (ref 3.5–5.1)
Sodium: 134 mmol/L — ABNORMAL LOW (ref 135–145)

## 2022-03-31 LAB — SURGICAL PCR SCREEN
MRSA, PCR: NEGATIVE
Staphylococcus aureus: POSITIVE — AB

## 2022-03-31 LAB — CBC
HCT: 45.1 % (ref 39.0–52.0)
Hemoglobin: 15.6 g/dL (ref 13.0–17.0)
MCH: 30.6 pg (ref 26.0–34.0)
MCHC: 34.6 g/dL (ref 30.0–36.0)
MCV: 88.6 fL (ref 80.0–100.0)
Platelets: 215 10*3/uL (ref 150–400)
RBC: 5.09 MIL/uL (ref 4.22–5.81)
RDW: 11.5 % (ref 11.5–15.5)
WBC: 7 10*3/uL (ref 4.0–10.5)
nRBC: 0 % (ref 0.0–0.2)

## 2022-03-31 NOTE — Progress Notes (Signed)
PCP - Dr. Kirk Ruths, Linna Hoff, [place] Cardiologist - Denies  PPM/ICD - Denies  Chest x-ray - 10/03/2019 Lumbar xrays: Today at PAT 03/31/2022 EKG - today at PAT 03/31/2022 Stress Test - Denies ECHO - Denies Cardiac Cath - Denies  Sleep Study -  Diagnosed with sleep apnea CPAP - Wears every night  Non-diabetic  Blood Thinner Instructions: Denies Aspirin Instructions:Denies  ERAS Protcol - Yes PRE-SURGERY Ensure  COVID TEST- Denies   Anesthesia review: No  Patient denies shortness of breath, fever, cough and chest pain at PAT appointment   All instructions explained to the patient, with a verbal understanding of the material. Patient agrees to go over the instructions while at home for a better understanding. Patient also instructed to self quarantine after being tested for COVID-19. The opportunity to ask questions was provided.

## 2022-04-06 ENCOUNTER — Ambulatory Visit (HOSPITAL_COMMUNITY): Payer: Worker's Compensation | Admitting: Certified Registered Nurse Anesthetist

## 2022-04-06 ENCOUNTER — Encounter (HOSPITAL_COMMUNITY)
Admission: RE | Disposition: A | Discharge status: Home / Self Care | Payer: Self-pay | Source: Home / Self Care | Attending: Specialist

## 2022-04-06 ENCOUNTER — Encounter (HOSPITAL_COMMUNITY): Payer: Self-pay | Admitting: Specialist

## 2022-04-06 ENCOUNTER — Ambulatory Visit (HOSPITAL_COMMUNITY): Payer: Worker's Compensation

## 2022-04-06 ENCOUNTER — Other Ambulatory Visit: Payer: Self-pay

## 2022-04-06 ENCOUNTER — Ambulatory Visit (HOSPITAL_COMMUNITY)
Admission: RE | Admit: 2022-04-06 | Discharge: 2022-04-06 | Disposition: A | Discharge status: Home / Self Care | Payer: Worker's Compensation | Attending: Specialist | Admitting: Specialist

## 2022-04-06 DIAGNOSIS — M48061 Spinal stenosis, lumbar region without neurogenic claudication: Secondary | ICD-10-CM | POA: Diagnosis present

## 2022-04-06 DIAGNOSIS — Z6835 Body mass index (BMI) 35.0-35.9, adult: Secondary | ICD-10-CM | POA: Insufficient documentation

## 2022-04-06 DIAGNOSIS — E669 Obesity, unspecified: Secondary | ICD-10-CM | POA: Insufficient documentation

## 2022-04-06 DIAGNOSIS — I10 Essential (primary) hypertension: Secondary | ICD-10-CM | POA: Insufficient documentation

## 2022-04-06 DIAGNOSIS — Z87891 Personal history of nicotine dependence: Secondary | ICD-10-CM | POA: Diagnosis not present

## 2022-04-06 DIAGNOSIS — M5126 Other intervertebral disc displacement, lumbar region: Secondary | ICD-10-CM | POA: Diagnosis present

## 2022-04-06 HISTORY — PX: LUMBAR LAMINECTOMY/DECOMPRESSION MICRODISCECTOMY: SHX5026

## 2022-04-06 SURGERY — LUMBAR LAMINECTOMY/DECOMPRESSION MICRODISCECTOMY 1 LEVEL
Anesthesia: General | Site: Spine Lumbar | Laterality: Right

## 2022-04-06 MED ORDER — PROPOFOL 10 MG/ML IV BOLUS
INTRAVENOUS | Status: AC
  Filled 2022-04-06: qty 20

## 2022-04-06 MED ORDER — ARTIFICIAL TEARS OPHTHALMIC OINT
TOPICAL_OINTMENT | OPHTHALMIC | Status: AC
  Filled 2022-04-06: qty 3.5

## 2022-04-06 MED ORDER — MENTHOL 3 MG MT LOZG: 1.0000 | LOZENGE | OROMUCOSAL | Status: DC | PRN

## 2022-04-06 MED ORDER — ACETAMINOPHEN 650 MG RE SUPP: 650.0000 mg | RECTAL | Status: DC | PRN

## 2022-04-06 MED ORDER — HYDROMORPHONE HCL 1 MG/ML IJ SOLN: 1.0000 mg | INTRAMUSCULAR | Status: DC | PRN

## 2022-04-06 MED ORDER — FENTANYL CITRATE (PF) 250 MCG/5ML IJ SOLN
INTRAMUSCULAR | Status: DC | PRN
  Administered 2022-04-06 (×4): 50 ug via INTRAVENOUS
  Administered 2022-04-06: 100 ug via INTRAVENOUS
  Administered 2022-04-06: 50 ug via INTRAVENOUS
  Administered 2022-04-06: 100 ug via INTRAVENOUS
  Administered 2022-04-06: 50 ug via INTRAVENOUS

## 2022-04-06 MED ORDER — PHENYLEPHRINE 80 MCG/ML (10ML) SYRINGE FOR IV PUSH (FOR BLOOD PRESSURE SUPPORT)
PREFILLED_SYRINGE | INTRAVENOUS | Status: DC | PRN
  Administered 2022-04-06 (×4): 80 ug via INTRAVENOUS

## 2022-04-06 MED ORDER — METHOCARBAMOL 750 MG PO TABS: 750.0000 mg | ORAL_TABLET | Freq: Three times a day (TID) | ORAL | 1 refills | Status: AC | PRN

## 2022-04-06 MED ORDER — MAGNESIUM CITRATE PO SOLN: 1.0000 | Freq: Once | ORAL | Status: DC | PRN

## 2022-04-06 MED ORDER — METHOCARBAMOL 1000 MG/10ML IJ SOLN: 500.0000 mg | Freq: Four times a day (QID) | INTRAVENOUS | Status: DC | PRN

## 2022-04-06 MED ORDER — AMISULPRIDE (ANTIEMETIC) 5 MG/2ML IV SOLN
INTRAVENOUS | Status: AC
  Filled 2022-04-06: qty 4

## 2022-04-06 MED ORDER — FENTANYL CITRATE (PF) 100 MCG/2ML IJ SOLN
INTRAMUSCULAR | Status: AC
  Filled 2022-04-06: qty 2

## 2022-04-06 MED ORDER — PHENYLEPHRINE 80 MCG/ML (10ML) SYRINGE FOR IV PUSH (FOR BLOOD PRESSURE SUPPORT)
PREFILLED_SYRINGE | INTRAVENOUS | Status: AC
  Filled 2022-04-06: qty 10

## 2022-04-06 MED ORDER — RISAQUAD PO CAPS: 1.0000 | ORAL_CAPSULE | Freq: Every day | ORAL | Status: DC

## 2022-04-06 MED ORDER — LIDOCAINE 2% (20 MG/ML) 5 ML SYRINGE
INTRAMUSCULAR | Status: DC | PRN
  Administered 2022-04-06: 40 mg via INTRAVENOUS
  Administered 2022-04-06: 60 mg via INTRAVENOUS

## 2022-04-06 MED ORDER — BUPIVACAINE-EPINEPHRINE (PF) 0.25% -1:200000 IJ SOLN
INTRAMUSCULAR | Status: AC
  Filled 2022-04-06: qty 30

## 2022-04-06 MED ORDER — PHENOL 1.4 % MT LIQD: 1.0000 | OROMUCOSAL | Status: DC | PRN

## 2022-04-06 MED ORDER — GABAPENTIN 300 MG PO CAPS: 300.0000 mg | ORAL_CAPSULE | Freq: Three times a day (TID) | ORAL | 1 refills | Status: AC | PRN

## 2022-04-06 MED ORDER — ACETAMINOPHEN 10 MG/ML IV SOLN
1000.0000 mg | INTRAVENOUS | Status: AC
  Administered 2022-04-06: 1000 mg via INTRAVENOUS
  Filled 2022-04-06: qty 100

## 2022-04-06 MED ORDER — CEFAZOLIN IN SODIUM CHLORIDE 3-0.9 GM/100ML-% IV SOLN: 3.0000 g | Freq: Three times a day (TID) | INTRAVENOUS | Status: DC

## 2022-04-06 MED ORDER — TRANEXAMIC ACID-NACL 1000-0.7 MG/100ML-% IV SOLN
1000.0000 mg | INTRAVENOUS | Status: AC
  Administered 2022-04-06: 1000 mg via INTRAVENOUS
  Filled 2022-04-06: qty 100

## 2022-04-06 MED ORDER — ONDANSETRON HCL 4 MG/2ML IJ SOLN
INTRAMUSCULAR | Status: DC | PRN
  Administered 2022-04-06: 4 mg via INTRAVENOUS

## 2022-04-06 MED ORDER — GABAPENTIN 300 MG PO CAPS
300.0000 mg | ORAL_CAPSULE | Freq: Three times a day (TID) | ORAL | Status: DC
  Administered 2022-04-06: 300 mg via ORAL
  Filled 2022-04-06: qty 1

## 2022-04-06 MED ORDER — OXYCODONE HCL 5 MG PO TABS: 5.0000 mg | ORAL_TABLET | Freq: Once | ORAL | Status: DC | PRN

## 2022-04-06 MED ORDER — OXYCODONE HCL 10 MG PO TABS: 10.0000 mg | ORAL_TABLET | ORAL | 0 refills | Status: AC | PRN

## 2022-04-06 MED ORDER — MAGNESIUM 250 MG PO TABS: 250.0000 mg | ORAL_TABLET | Freq: Every day | ORAL | Status: DC

## 2022-04-06 MED ORDER — CHLORHEXIDINE GLUCONATE 0.12 % MT SOLN
15.0000 mL | Freq: Once | OROMUCOSAL | Status: AC
  Administered 2022-04-06: 15 mL via OROMUCOSAL
  Filled 2022-04-06: qty 15

## 2022-04-06 MED ORDER — HYDROMORPHONE HCL 1 MG/ML IJ SOLN
INTRAMUSCULAR | Status: DC | PRN
  Administered 2022-04-06: .5 mg via INTRAVENOUS

## 2022-04-06 MED ORDER — DEXAMETHASONE SODIUM PHOSPHATE 10 MG/ML IJ SOLN
INTRAMUSCULAR | Status: DC | PRN
  Administered 2022-04-06: 10 mg via INTRAVENOUS

## 2022-04-06 MED ORDER — KCL IN DEXTROSE-NACL 20-5-0.45 MEQ/L-%-% IV SOLN
INTRAVENOUS | Status: DC
  Filled 2022-04-06: qty 1000

## 2022-04-06 MED ORDER — HYDROCHLOROTHIAZIDE 12.5 MG PO TABS: 12.5000 mg | ORAL_TABLET | Freq: Every day | ORAL | Status: DC

## 2022-04-06 MED ORDER — CEFAZOLIN SODIUM-DEXTROSE 2-4 GM/100ML-% IV SOLN
2.0000 g | Freq: Three times a day (TID) | INTRAVENOUS | Status: DC
  Administered 2022-04-06: 2 g via INTRAVENOUS
  Filled 2022-04-06: qty 100

## 2022-04-06 MED ORDER — LISINOPRIL 20 MG PO TABS: 20.0000 mg | ORAL_TABLET | Freq: Every day | ORAL | Status: DC

## 2022-04-06 MED ORDER — ALUM & MAG HYDROXIDE-SIMETH 200-200-20 MG/5ML PO SUSP: 30.0000 mL | Freq: Four times a day (QID) | ORAL | Status: DC | PRN

## 2022-04-06 MED ORDER — BUPIVACAINE-EPINEPHRINE (PF) 0.25% -1:200000 IJ SOLN
INTRAMUSCULAR | Status: DC | PRN
  Administered 2022-04-06: 2 mL

## 2022-04-06 MED ORDER — ORAL CARE MOUTH RINSE: 15.0000 mL | Freq: Once | OROMUCOSAL | Status: AC

## 2022-04-06 MED ORDER — ACETAMINOPHEN 325 MG PO TABS: 650.0000 mg | ORAL_TABLET | ORAL | Status: DC | PRN

## 2022-04-06 MED ORDER — OXYCODONE HCL 5 MG PO TABS
5.0000 mg | ORAL_TABLET | ORAL | Status: DC | PRN
  Administered 2022-04-06: 5 mg via ORAL
  Filled 2022-04-06: qty 1

## 2022-04-06 MED ORDER — PROMETHAZINE HCL 25 MG/ML IJ SOLN
6.2500 mg | INTRAMUSCULAR | Status: DC | PRN
  Administered 2022-04-06: 12.5 mg via INTRAVENOUS

## 2022-04-06 MED ORDER — ACETAMINOPHEN 10 MG/ML IV SOLN: 1000.0000 mg | Freq: Once | INTRAVENOUS | Status: DC | PRN

## 2022-04-06 MED ORDER — KETOROLAC TROMETHAMINE 30 MG/ML IJ SOLN: 30.0000 mg | Freq: Once | INTRAMUSCULAR | Status: DC

## 2022-04-06 MED ORDER — POLYETHYLENE GLYCOL 3350 17 G PO PACK: 17.0000 g | PACK | Freq: Every day | ORAL | Status: DC | PRN

## 2022-04-06 MED ORDER — HYDROMORPHONE HCL 1 MG/ML IJ SOLN
INTRAMUSCULAR | Status: AC
  Filled 2022-04-06: qty 0.5

## 2022-04-06 MED ORDER — THROMBIN (RECOMBINANT) 20000 UNITS EX SOLR
CUTANEOUS | Status: AC
  Filled 2022-04-06: qty 20000

## 2022-04-06 MED ORDER — SUGAMMADEX SODIUM 200 MG/2ML IV SOLN
INTRAVENOUS | Status: DC | PRN
  Administered 2022-04-06: 200 mg via INTRAVENOUS

## 2022-04-06 MED ORDER — MIDAZOLAM HCL 2 MG/2ML IJ SOLN
INTRAMUSCULAR | Status: AC
  Filled 2022-04-06: qty 2

## 2022-04-06 MED ORDER — DOCUSATE SODIUM 100 MG PO CAPS: 100.0000 mg | ORAL_CAPSULE | Freq: Two times a day (BID) | ORAL | Status: DC

## 2022-04-06 MED ORDER — PROPOFOL 10 MG/ML IV BOLUS
INTRAVENOUS | Status: DC | PRN
  Administered 2022-04-06: 200 mg via INTRAVENOUS
  Administered 2022-04-06: 30 mg via INTRAVENOUS
  Administered 2022-04-06: 20 mg via INTRAVENOUS

## 2022-04-06 MED ORDER — OXYCODONE HCL 5 MG PO TABS: 10.0000 mg | ORAL_TABLET | ORAL | Status: DC | PRN

## 2022-04-06 MED ORDER — AMISULPRIDE (ANTIEMETIC) 5 MG/2ML IV SOLN
10.0000 mg | Freq: Once | INTRAVENOUS | Status: AC | PRN
  Administered 2022-04-06: 10 mg via INTRAVENOUS

## 2022-04-06 MED ORDER — LIDOCAINE 2% (20 MG/ML) 5 ML SYRINGE
INTRAMUSCULAR | Status: AC
  Filled 2022-04-06: qty 5

## 2022-04-06 MED ORDER — PROMETHAZINE HCL 25 MG/ML IJ SOLN
INTRAMUSCULAR | Status: AC
  Filled 2022-04-06: qty 1

## 2022-04-06 MED ORDER — THROMBIN (RECOMBINANT) 20000 UNITS EX SOLR
CUTANEOUS | Status: DC | PRN
  Administered 2022-04-06: 20000 [IU] via TOPICAL

## 2022-04-06 MED ORDER — DEXAMETHASONE SODIUM PHOSPHATE 10 MG/ML IJ SOLN
INTRAMUSCULAR | Status: AC
  Filled 2022-04-06: qty 1

## 2022-04-06 MED ORDER — MAGNESIUM OXIDE -MG SUPPLEMENT 400 (240 MG) MG PO TABS: 200.0000 mg | ORAL_TABLET | Freq: Every day | ORAL | Status: DC

## 2022-04-06 MED ORDER — FENTANYL CITRATE (PF) 100 MCG/2ML IJ SOLN
25.0000 ug | INTRAMUSCULAR | Status: DC | PRN
  Administered 2022-04-06: 50 ug via INTRAVENOUS

## 2022-04-06 MED ORDER — OXYCODONE HCL 5 MG PO TABS: 5.0000 mg | ORAL_TABLET | ORAL | Status: DC | PRN

## 2022-04-06 MED ORDER — POLYETHYLENE GLYCOL 3350 17 G PO PACK: 17.0000 g | PACK | Freq: Every day | ORAL | 0 refills | Status: AC

## 2022-04-06 MED ORDER — ONDANSETRON HCL 4 MG/2ML IJ SOLN: 4.0000 mg | Freq: Four times a day (QID) | INTRAMUSCULAR | Status: DC | PRN

## 2022-04-06 MED ORDER — KCL IN DEXTROSE-NACL 20-5-0.45 MEQ/L-%-% IV SOLN: INTRAVENOUS | Status: DC

## 2022-04-06 MED ORDER — FENTANYL CITRATE (PF) 250 MCG/5ML IJ SOLN
INTRAMUSCULAR | Status: AC
  Filled 2022-04-06: qty 5

## 2022-04-06 MED ORDER — ONDANSETRON HCL 4 MG PO TABS: 4.0000 mg | ORAL_TABLET | Freq: Four times a day (QID) | ORAL | Status: DC | PRN

## 2022-04-06 MED ORDER — ROCURONIUM BROMIDE 10 MG/ML (PF) SYRINGE
PREFILLED_SYRINGE | INTRAVENOUS | Status: AC
  Filled 2022-04-06: qty 10

## 2022-04-06 MED ORDER — LACTATED RINGERS IV SOLN: INTRAVENOUS | Status: DC

## 2022-04-06 MED ORDER — DOCUSATE SODIUM 100 MG PO CAPS: 100.0000 mg | ORAL_CAPSULE | Freq: Two times a day (BID) | ORAL | 1 refills | Status: AC | PRN

## 2022-04-06 MED ORDER — PHENYLEPHRINE HCL-NACL 20-0.9 MG/250ML-% IV SOLN
INTRAVENOUS | Status: DC | PRN
  Administered 2022-04-06: 20 ug/min via INTRAVENOUS

## 2022-04-06 MED ORDER — METHOCARBAMOL 500 MG PO TABS: 500.0000 mg | ORAL_TABLET | Freq: Four times a day (QID) | ORAL | Status: DC | PRN

## 2022-04-06 MED ORDER — BISACODYL 5 MG PO TBEC: 5.0000 mg | DELAYED_RELEASE_TABLET | Freq: Every day | ORAL | Status: DC | PRN

## 2022-04-06 MED ORDER — METHOCARBAMOL 500 MG PO TABS
500.0000 mg | ORAL_TABLET | Freq: Four times a day (QID) | ORAL | Status: DC | PRN
  Administered 2022-04-06: 500 mg via ORAL
  Filled 2022-04-06: qty 1

## 2022-04-06 MED ORDER — 0.9 % SODIUM CHLORIDE (POUR BTL) OPTIME
TOPICAL | Status: DC | PRN
  Administered 2022-04-06: 1000 mL

## 2022-04-06 MED ORDER — ONDANSETRON HCL 4 MG/2ML IJ SOLN
INTRAMUSCULAR | Status: AC
  Filled 2022-04-06: qty 2

## 2022-04-06 MED ORDER — ROCURONIUM BROMIDE 10 MG/ML (PF) SYRINGE
PREFILLED_SYRINGE | INTRAVENOUS | Status: DC | PRN
  Administered 2022-04-06: 40 mg via INTRAVENOUS
  Administered 2022-04-06: 60 mg via INTRAVENOUS

## 2022-04-06 MED ORDER — KETOROLAC TROMETHAMINE 30 MG/ML IJ SOLN
INTRAMUSCULAR | Status: AC
  Administered 2022-04-06: 30 mg via INTRAVENOUS
  Filled 2022-04-06: qty 1

## 2022-04-06 MED ORDER — VITAMIN B-12 1000 MCG PO TABS: 1000.0000 ug | ORAL_TABLET | Freq: Every day | ORAL | Status: DC

## 2022-04-06 MED ORDER — LISINOPRIL-HYDROCHLOROTHIAZIDE 20-12.5 MG PO TABS: 1.0000 | ORAL_TABLET | Freq: Every day | ORAL | Status: DC

## 2022-04-06 MED ORDER — MIDAZOLAM HCL 2 MG/2ML IJ SOLN
INTRAMUSCULAR | Status: DC | PRN
  Administered 2022-04-06: 2 mg via INTRAVENOUS

## 2022-04-06 MED ORDER — CEFAZOLIN IN SODIUM CHLORIDE 3-0.9 GM/100ML-% IV SOLN
3.0000 g | INTRAVENOUS | Status: AC
  Administered 2022-04-06: 3 g via INTRAVENOUS
  Filled 2022-04-06: qty 100

## 2022-04-06 MED ORDER — OXYCODONE HCL 5 MG/5ML PO SOLN: 5.0000 mg | Freq: Once | ORAL | Status: DC | PRN

## 2022-04-06 SURGICAL SUPPLY — 58 items
BAG COUNTER SPONGE SURGICOUNT (BAG) ×1 IMPLANT
BAG DECANTER FOR FLEXI CONT (MISCELLANEOUS) IMPLANT
BAG SPNG CNTER NS LX DISP (BAG) ×1
BAND INSRT 18 STRL LF DISP RB (MISCELLANEOUS) ×2
BAND RUBBER #18 3X1/16 STRL (MISCELLANEOUS) ×2 IMPLANT
BUR EGG ELITE 5.0 (BURR) IMPLANT
BUR RND DIAMOND ELITE 4.0 (BURR) IMPLANT
CLEANER TIP ELECTROSURG 2X2 (MISCELLANEOUS) ×1 IMPLANT
CNTNR URN SCR LID CUP LEK RST (MISCELLANEOUS) ×1 IMPLANT
CONT SPEC 4OZ STRL OR WHT (MISCELLANEOUS) ×1
DRAPE LAPAROTOMY 100X72X124 (DRAPES) ×1 IMPLANT
DRAPE MICROSCOPE SLANT 54X150 (MISCELLANEOUS) ×1 IMPLANT
DRAPE SHEET LG 3/4 BI-LAMINATE (DRAPES) ×1 IMPLANT
DRAPE SURG 17X11 SM STRL (DRAPES) ×1 IMPLANT
DRAPE UTILITY XL STRL (DRAPES) ×1 IMPLANT
DRSG AQUACEL AG ADV 3.5X 4 (GAUZE/BANDAGES/DRESSINGS) IMPLANT
DRSG AQUACEL AG ADV 3.5X 6 (GAUZE/BANDAGES/DRESSINGS) IMPLANT
DRSG TELFA 3X8 NADH STRL (GAUZE/BANDAGES/DRESSINGS) IMPLANT
DURAPREP 26ML APPLICATOR (WOUND CARE) ×1 IMPLANT
DURASEAL SPINE SEALANT 3ML (MISCELLANEOUS) IMPLANT
ELECT BLADE 4.0 EZ CLEAN MEGAD (MISCELLANEOUS)
ELECT REM PT RETURN 9FT ADLT (ELECTROSURGICAL) ×1
ELECTRODE BLDE 4.0 EZ CLN MEGD (MISCELLANEOUS) IMPLANT
ELECTRODE REM PT RTRN 9FT ADLT (ELECTROSURGICAL) ×1 IMPLANT
GLOVE BIOGEL PI IND STRL 7.5 (GLOVE) ×1 IMPLANT
GLOVE SURG SS PI 7.0 STRL IVOR (GLOVE) ×1 IMPLANT
GLOVE SURG SS PI 8.0 STRL IVOR (GLOVE) ×2 IMPLANT
GOWN STRL REUS W/ TWL LRG LVL3 (GOWN DISPOSABLE) ×1 IMPLANT
GOWN STRL REUS W/ TWL XL LVL3 (GOWN DISPOSABLE) ×1 IMPLANT
GOWN STRL REUS W/TWL LRG LVL3 (GOWN DISPOSABLE) ×1
GOWN STRL REUS W/TWL XL LVL3 (GOWN DISPOSABLE) ×1
IV CATH 14GX2 1/4 (CATHETERS) ×1 IMPLANT
KIT BASIN OR (CUSTOM PROCEDURE TRAY) ×1 IMPLANT
NDL 22X1.5 STRL (OR ONLY) (MISCELLANEOUS) ×1 IMPLANT
NDL SPNL 18GX3.5 QUINCKE PK (NEEDLE) ×2 IMPLANT
NEEDLE 22X1.5 STRL (OR ONLY) (MISCELLANEOUS) ×1 IMPLANT
NEEDLE SPNL 18GX3.5 QUINCKE PK (NEEDLE) ×2 IMPLANT
PACK LAMINECTOMY NEURO (CUSTOM PROCEDURE TRAY) ×1 IMPLANT
PATTIES SURGICAL .75X.75 (GAUZE/BANDAGES/DRESSINGS) ×1 IMPLANT
SOLUTION PRONTOSAN WOUND 350ML (IRRIGATION / IRRIGATOR) IMPLANT
SPONGE SURGIFOAM ABS GEL 100 (HEMOSTASIS) ×1 IMPLANT
SPONGE T-LAP 4X18 ~~LOC~~+RFID (SPONGE) IMPLANT
STAPLER VISISTAT (STAPLE) IMPLANT
STRIP CLOSURE SKIN 1/2X4 (GAUZE/BANDAGES/DRESSINGS) ×1 IMPLANT
SUT NURALON 4 0 TR CR/8 (SUTURE) IMPLANT
SUT PROLENE 3 0 PS 2 (SUTURE) IMPLANT
SUT VIC AB 1 CT1 27 (SUTURE)
SUT VIC AB 1 CT1 27XBRD ANTBC (SUTURE) IMPLANT
SUT VIC AB 1-0 CT2 27 (SUTURE) IMPLANT
SUT VIC AB 2-0 CT1 27 (SUTURE)
SUT VIC AB 2-0 CT1 TAPERPNT 27 (SUTURE) IMPLANT
SUT VIC AB 2-0 CT2 27 (SUTURE) IMPLANT
SYR 3ML LL SCALE MARK (SYRINGE) ×1 IMPLANT
TOWEL GREEN STERILE (TOWEL DISPOSABLE) ×1 IMPLANT
TOWEL GREEN STERILE FF (TOWEL DISPOSABLE) ×1 IMPLANT
TRAY FOLEY MTR SLVR 16FR STAT (SET/KITS/TRAYS/PACK) ×1 IMPLANT
WIPE CHG 2% 2PK PREOPERATIVE (MISCELLANEOUS) ×1 IMPLANT
YANKAUER SUCT BULB TIP NO VENT (SUCTIONS) ×1 IMPLANT

## 2022-04-06 NOTE — Anesthesia Postprocedure Evaluation (Signed)
Anesthesia Post Note  Patient: RANNY HERTENSTEIN  Procedure(s) Performed: Laminotomy and microdiscectomy Lumbar four-five Right (Right: Spine Lumbar)     Patient location during evaluation: PACU Anesthesia Type: General Level of consciousness: awake Pain management: pain level controlled Vital Signs Assessment: post-procedure vital signs reviewed and stable Respiratory status: spontaneous breathing, nonlabored ventilation and respiratory function stable Cardiovascular status: blood pressure returned to baseline and stable Postop Assessment: no apparent nausea or vomiting Anesthetic complications: no   No notable events documented.  Last Vitals:  Vitals:   04/06/22 1500 04/06/22 1519  BP:  126/69  Pulse: 75 78  Resp: 12 17  Temp: 36.9 C 36.9 C  SpO2: 96% 97%    Last Pain:  Vitals:   04/06/22 1913  TempSrc:   PainSc: 3                  Evalisse Prajapati P [place]

## 2022-04-06 NOTE — Transfer of Care (Signed)
Immediate Anesthesia Transfer of Care Note  Patient: Tanner Wilson  Procedure(s) Performed: Laminotomy and microdiscectomy Lumbar four-five Right (Right: Spine Lumbar)  Patient Location: PACU  Anesthesia Type:General  Level of Consciousness: patient cooperative and responds to stimulation  Airway & Oxygen Therapy: Patient Spontanous Breathing and Patient connected to face mask oxygen  Post-op Assessment: Report given to RN and Post -op Vital signs reviewed and stable  Post vital signs: Reviewed and stable  Last Vitals:  Vitals Value Taken Time  BP 137/80 04/06/22 1330  Temp    Pulse 98 04/06/22 1332  Resp 18 04/06/22 1332  SpO2 95 % 04/06/22 1332  Vitals shown include unvalidated device data.  Last Pain:  Vitals:   04/06/22 0908  TempSrc:   PainSc: 4       Patients Stated Pain Goal: 0 ([id number])  Complications: No notable events documented.

## 2022-04-06 NOTE — Anesthesia Procedure Notes (Signed)
Procedure Name: Intubation Date/Time: 04/06/2022 10:19 AM  Performed by: Betha Loa, CRNAPre-anesthesia Checklist: Patient identified, Emergency Drugs available, Suction available and Patient being monitored Patient Re-evaluated:Patient Re-evaluated prior to induction Oxygen Delivery Method: Circle System Utilized Preoxygenation: Pre-oxygenation with 100% oxygen Induction Type: IV induction Ventilation: Mask ventilation without difficulty, Two handed mask ventilation required and Oral airway inserted - appropriate to patient size Laryngoscope Size: Mac and 4 Tube type: Oral Tube size: 7.5 mm Number of attempts: 1 Airway Equipment and Method: Stylet and Bite block Placement Confirmation: ETT inserted through vocal cords under direct vision, positive ETCO2 and breath sounds checked- equal and bilateral Secured at: 22 cm Tube secured with: Tape Dental Injury: Teeth and Oropharynx as per pre-operative assessment  Comments: Pt c/o "numb tip of tongue" after last back surgery.  Spoke to pt and wife at length regarding symptoms and possible causes.  Pt agreed and MDA aware, soft bite block placed L) side of mouth/molars to keep tongue FOP.

## 2022-04-06 NOTE — Discharge Instructions (Signed)

## 2022-04-06 NOTE — Evaluation (Signed)
Occupational Therapy Evaluation Patient Details Name: Tanner Wilson MRN: [id number] DOB: 12/26/1975 Today's Date: 04/06/2022   History of Present Illness Pt is a 47 y.o. male s/p revision decompression L 4-5 on the R with partial medial hemi facetectomy, foraminotomies and micro discectomies L4-5. PMH significant for back surgery, carpal tunnel release, HTN, and high cholesterol.   Clinical Impression   PTA, pt was mod I and lived with his wife. Upon eval, pt performing ADL with mod I. Pt educated and demonstrating use of compensatory strategies for bed mobility, LB ADL, shower transfers, grooming, toileting, stairs, car transfers within precautions. Reviewed recommendations for movement/mobility as recommended by physician. Recommending discharge home with wife to assist as needed.      Recommendations for follow up therapy are one component of a multi-disciplinary discharge planning process, led by the attending physician.  Recommendations may be updated based on patient status, additional functional criteria and insurance authorization.   Follow Up Recommendations  No OT follow up     Assistance Recommended at Discharge Intermittent Supervision/Assistance  Patient can return home with the following Assistance with cooking/housework;Help with stairs or ramp for entrance;Assist for transportation    Functional Status Assessment  Patient has had a recent decline in their functional status and demonstrates the ability to make significant improvements in function in a reasonable and predictable amount of time.  Equipment Recommendations  BSC/3in1    Recommendations for Other Services       Precautions / Restrictions Precautions Precautions: Back Precaution Booklet Issued: Yes (comment) Precaution Comments: all precautions reviewed during ADL and functional mobility Required Braces or Orthoses:  (no brace needed orders, but pt with brace at home) Restrictions Weight Bearing  Restrictions: No      Mobility Bed Mobility Overal bed mobility: Modified Independent             General bed mobility comments: increased time and cues for initial learning    Transfers Overall transfer level: Modified independent Equipment used: None                      Balance Overall balance assessment: Modified Independent                                         ADL either performed or assessed with clinical judgement   ADL Overall ADL's : Modified independent                                       General ADL Comments: mod I for increased time, use of AE     Vision Baseline Vision/History: 0 No visual deficits Ability to See in Adequate Light: 0 Adequate Patient Visual Report: No change from baseline Vision Assessment?: No apparent visual deficits     Perception Perception Perception Tested?: No   Praxis Praxis Praxis tested?: Within functional limits    Pertinent Vitals/Pain Pain Assessment Pain Assessment: No/denies pain     Hand Dominance     Extremity/Trunk Assessment     Lower Extremity Assessment Lower Extremity Assessment: Overall WFL for tasks assessed   Cervical / Trunk Assessment Cervical / Trunk Assessment: Back Surgery   Communication Communication Communication: No difficulties   Cognition Arousal/Alertness: Awake/alert Behavior During Therapy: WFL for tasks assessed/performed Overall Cognitive Status: Within Functional Limits for  tasks assessed                                       General Comments       Exercises     Shoulder Instructions      Home Living Family/patient expects to be discharged to:: Private residence Living Arrangements: Spouse/significant other Available Help at Discharge: Family Type of Home: House Home Access: Stairs to enter Technical brewer of Steps: 2 Entrance Stairs-Rails: Right Home Layout: One level     Bathroom  Shower/Tub: Teacher, early years/pre: [place]: Rollator (4 wheels);Shower seat          Prior Functioning/Environment Prior Level of Function : Independent/Modified Independent             Mobility Comments: no AD ADLs Comments: intermittent assist with LB ADL until recent purchase of AE        OT Problem List: Decreased strength;Decreased activity tolerance;Impaired balance (sitting and/or standing)      OT Treatment/Interventions:      OT Goals(Current goals can be found in the care plan section) Acute Rehab OT Goals Patient Stated Goal: go home OT Goal Formulation: With patient Time For Goal Achievement: 04/20/22 Potential to Achieve Goals: Good  OT Frequency:      Co-evaluation              AM-PAC OT "6 Clicks" Daily Activity     Outcome Measure Help from another person eating meals?: None Help from another person taking care of personal grooming?: None Help from another person toileting, which includes using toliet, bedpan, or urinal?: None Help from another person bathing (including washing, rinsing, drying)?: None Help from another person to put on and taking off regular upper body clothing?: None Help from another person to put on and taking off regular lower body clothing?: None 6 Click Score: 24   End of Session Equipment Utilized During Treatment: Gait belt Nurse Communication: Mobility status;Other (comment) (needs BSC)  Activity Tolerance: Patient tolerated treatment well Patient left: in bed;with call bell/phone within reach;with family/visitor present  OT Visit Diagnosis: Unsteadiness on feet (R26.81);Muscle weakness (generalized) (M62.81);Other abnormalities of gait and mobility (R26.89)                Time: 1631-1700 OT Time Calculation (min): 29 min Charges:  OT General Charges $OT Visit: 1 Visit OT Evaluation $OT Eval Low Complexity: 1 Low OT Treatments $Self Care/Home Management : 8-22 mins  Elder Cyphers, OTR/L [facility] Acute Rehabilitation Office: [phone]   Tanner Wilson 04/06/2022, 5:13 PM

## 2022-04-06 NOTE — Interval H&P Note (Signed)
History and Physical Interval Note:  04/06/2022 9:44 AM  Tanner Wilson  has presented today for surgery, with the diagnosis of Herniated disc L4-5 Right.  The various methods of treatment have been discussed with the patient and family. After consideration of risks, benefits and other options for treatment, the patient has consented to  Procedure(s): Laminotomy and microdiscectomy L4-5 Right (Right) as a surgical intervention.  The patient's history has been reviewed, patient examined, no change in status, stable for surgery.  I have reviewed the patient's chart and labs.  Questions were answered to the patient's satisfaction.     Johnn Hai

## 2022-04-06 NOTE — Op Note (Signed)
NAME: Tanner Wilson, Tanner Wilson MEDICAL RECORD NO: [id number] ACCOUNT NO: [id number] DATE OF BIRTH: December 13, 1975 FACILITY: [facility] LOCATION: [facility] PHYSICIAN: Johnn Hai, MD  Operative Report   DATE OF PROCEDURE: 04/06/2022  PREOPERATIVE DIAGNOSIS:  Recurrent disk herniation at L4-L5.  POSTOPERATIVE DIAGNOSES:  Recurrent disk herniation at L4-L5, spinal stenosis L4-L5, severe hypertrophic facet at L4-L5, right.  PROCEDURE PERFORMED:   1.  Revision decompression L4-L5, right. 2.  Partial medial hemifacetectomy. 3.  Foraminotomies L4-L5. 4.  Microdiskectomy L4-L5.  ANESTHESIA:  General.  ASSISTANT:  Lacie Draft, PA.  HISTORY:  This is a 47 year old male with right lower extremity radicular pain L5 nerve root distribution, neural tension signs, EHL weakness due to a large disk herniation at L4-L5 to the right, displacing the L5 nerve root.  He had a history of lumbar  decompression L4-L5.  There was a small disk at L5-S1, slightly space in S1 nerve root.  He had no S1 nerve root symptoms.  He was indicated for a decompression, L4-L5 on the right partial and lateral recess decompression foraminotomies and partial  hemifacetectomy due to severe facet arthrosis at L4-L5 on the right.  Risks and benefits discussed including bleeding, infection, damage to the neurovascular structures, no change in symptoms, worsening symptoms, DVT, PE, anesthetic complications, etc.  DESCRIPTION OF PROCEDURE:  With the patient in beach chair supine position, after induction of adequate general anesthesia, 3 grams Kefzol he was placed prone on the Wilson frame.  All bony prominences were well padded.  Lumbar region was prepped and  draped in the usual sterile fashion.  Two 18-gauge spinal needles utilized to localize a 4-5 interspace, confirmed with x-ray.  Part of the previous surgical incision was utilized, extending caudad.  Subcutaneous tissue was dissected.  Electrocautery was  utilized to achieve hemostasis.   Dorsal lumbar fascia divided in line with skin incision.  Paraspinous muscle elevated from lamina L4-L5.  McCulloch retractor was placed.  Operating microscope was draped and brought in the surgical field.  A  confirmatory radiograph obtained.  The patient had a near absence of the interlaminar window on the right at L4-L5 due to severe facet hypertrophy.  This extended the facet medially and laterally.  I therefore used a high-speed bur and began performing a  partial medial hemifacetectomy.  The inferior lamina of 4 was partially removed.  I used a combination of the bur and the 2 and 3 mm Kerrison.  I identified the superior portion of the lamina of L5.  After using high-speed bur on the medial portion of  the L4-L5 facet on the right.  I then completed the partial removal of the inferior process with a 2 mm Kerrison. This was less than 50% of the facet.  I then identified the superior articulating process, which again was very hypertrophic. Extending  medially and cephalad.  There was a vascular bed within the facet itself and I used bipolar cautery and bone wax to achieve hemostasis.  I identified the medial border of the superior articulating process of 5 with the [facility].  I then with a micro  curette and a Gaspar Garbe was able to gain access to the lateral recess at L4-L5.  Over the [facility] protecting the neural elements I detached ligamentum flavum from the cephalad edge of L5 with a straight curette.  I then removed a portion of the  ligamentum flavum, which was hypertrophic from the interspace.  I then with the neural elements protected used a 2 mm Kerrison to decompress the  lateral recess to the medial border of the pedicle then also performed a foraminotomy of L5.  I identified  the L5 root.  Gently mobilized it medially and continued with the decompression of the lateral recess.  I performed a foraminotomy of 4.  As there was a large portion of the superior articulating process I was hypertrophic  and fragmented.  This was  removed.  Protected superior articulating process of the 5, I removed the medial portion of that with a 2 mm Kerrison.  Just to get lateral to the thecal sac and the nerve root, the disk herniation was noted and was large.  I protected the thecal sac and  after removing a portion of the facet I passed a Barbra Sarks out the foramen of 4 and it was widely patent and cephalad as I decompressed cephalad to the point of detaching ligamentum flavum.  There was scar tissue noted from the previous surgery noted  centrally.  Then, I identified a herniated disk.  I went lateral to that, proximal to the mid portion of the pedicle and gently swept the L5 root and thecal sac medially.  I then to palpation felt like a hardened disk or calcified disk.  I used an  18-gauge needle to pierce the disk herniation.  This was disk herniation material.  Following that, I performed the annulotomy.  Copious portion of disk material was removed from the disk space with a straight and upbiting micropituitary.  Further  mobilized with an Epstein and a nerve hook.  There was a portion of the annulus it was calcified, likely from the previous surgery.  I used a 2 mm Kerrison within the disk space to remove this portion of the cephalad and medial to the pedicle of L5.   Following this, I gained further access to soft herniated material.  Multiple fragments were excised.  Again, protecting the thecal sac and the L5 root with intermittent release of traction of the L5 root.  Next copiously irrigated the disk space with  catheter lavage and removed additional fragments.  Given the calcification of the disk we checked another intraoperative film to confirm our level.  An attempt to review the MRI again to the anatomic structures that we were seen intraoperatively with the  MRI.  The MRI was not available due to an Internet access issue.  I did feel though that we had exposed the L5 root and the disk satisfactorily.  The  facet was fairly hypertrophic.  I used bone wax on the cancellous surfaces.  Woodson probe passed  freely above the pedicle 4 and below the pedicle of L5.  Checked beneath thecal sac and Woodson probe passed freely out the foramen of 4.  Copiously irrigated with irrigation.  No active bleeding or CSF leakage.  Bone wax was placed on the cancellous  surfaces. Thrombin-soaked patties were placed in the wound and then retrieved.  I checked out the foramen of L5 underneath the thecal sac, the axilla of the root, the shoulder of the root.  There was no further soft tissue disk herniation amenable to a  diskectomy.  I felt the L5 root was well decompressed with 1 cm excursion medial pedicle without tension and the probe passed freely out the foramen of L5.  I therefore removed the McCulloch retractor, irrigated copiously and meticulously achieved  hemostasis.  No active bleeding or CSF leakage.  I closed the dorsal lumbar fascia with 1  Vicryl in interrupted figure-of-eight sutures, subcutaneous with 2-0 and  skin with Prolene.  Sterile dressing applied.  Placed supine on the hospital bed,  extubated without difficulty and transported to the recovery room in satisfactory condition.  The patient tolerated the procedure well.  No complications.  ASSISTANT:  Lacie Draft, PA.  BLOOD LOSS:  25 mL.  Technical difficulty increased due to the patient's severe hypertrophic facet, previous surgery and the calcified disk.   PUS D: 04/06/2022 1:32:03 pm T: 04/06/2022 2:24:00 pm  JOB: [id number]

## 2022-04-06 NOTE — Anesthesia Preprocedure Evaluation (Addendum)
Anesthesia Evaluation  Patient identified by MRN, date of birth, ID band Patient awake    Reviewed: Allergy & Precautions, NPO status , Patient's Chart, lab work & pertinent test results  Airway Mallampati: II  TM Distance: >3 FB Neck ROM: Full    Dental no notable dental hx.    Pulmonary former smoker   Pulmonary exam normal        Cardiovascular hypertension, Pt. on medications Normal cardiovascular exam     Neuro/Psych negative neurological ROS  negative psych ROS   GI/Hepatic negative GI ROS,,,(+)     substance abuse    Endo/Other  negative endocrine ROS    Renal/GU negative Renal ROS     Musculoskeletal negative musculoskeletal ROS (+)  narcotic dependent  Abdominal  (+) + obese  Peds  Hematology negative hematology ROS (+)   Anesthesia Other Findings Herniated disc L4-5 Right  Reproductive/Obstetrics                             Anesthesia Physical Anesthesia Plan  ASA: 2  Anesthesia Plan: General   Post-op Pain Management:    Induction: Intravenous  PONV Risk Score and Plan: 2 and Ondansetron, Dexamethasone, Midazolam and Treatment may vary due to age or medical condition  Airway Management Planned: Oral ETT  Additional Equipment:   Intra-op Plan:   Post-operative Plan: Extubation in OR  Informed Consent: I have reviewed the patients History and Physical, chart, labs and discussed the procedure including the risks, benefits and alternatives for the proposed anesthesia with the patient or authorized representative who has indicated his/her understanding and acceptance.     Dental advisory given  Plan Discussed with: CRNA  Anesthesia Plan Comments:        Anesthesia Quick Evaluation

## 2022-04-06 NOTE — Brief Op Note (Signed)
04/06/2022  9:45 AM  PATIENT:  Rosalyn Gess  47 y.o. male  PRE-OPERATIVE DIAGNOSIS:  Herniated disc L4-5 Right  POST-OPERATIVE DIAGNOSIS:  * No post-op diagnosis entered *  PROCEDURE:  Procedure(s): Laminotomy and microdiscectomy L4-5 Right (Right)  SURGEON:  Surgeon(s) and Role:    Susa Day, MD - Primary  PHYSICIAN ASSISTANT:   ASSISTANTS: Bissell      ANESTHESIA:   general  EBL:  25   BLOOD ADMINISTERED:none  DRAINS: none   LOCAL MEDICATIONS USED:  MARCAINE     SPECIMEN:  No Specimen  DISPOSITION OF SPECIMEN:  N/A  COUNTS:  YES  TOURNIQUET:  * No tourniquets in log *  DICTATION: .Other Dictation: Dictation Number [id number]   PLAN OF CARE: Admit for overnight observation  PATIENT DISPOSITION:  PACU - hemodynamically stable.   Delay start of Pharmacological VTE agent (>24hrs) due to surgical blood loss or risk of bleeding: yes

## 2022-04-06 NOTE — Progress Notes (Signed)
Patient alert and oriented, mae's well, voiding adequate amount of urine, swallowing without difficulty, no c/o pain at time of discharge. Patient discharged home with family. Script and discharged instructions given to patient. Patient and family stated understanding of instructions given. Patient has an appointment with Dr. Beane ?

## 2022-04-07 ENCOUNTER — Encounter (HOSPITAL_COMMUNITY): Payer: Self-pay | Admitting: Specialist
